# Patient Record
Sex: Male | Born: 1940 | Race: White | Hispanic: No | Marital: Married | Smoking: Former smoker
Health system: Southern US, Community
[De-identification: ages and names within clinical notes are randomized; demographics above are authoritative.]

## PROBLEM LIST (undated history)

## (undated) DIAGNOSIS — E785 Hyperlipidemia, unspecified: Secondary | ICD-10-CM

## (undated) DIAGNOSIS — G4733 Obstructive sleep apnea (adult) (pediatric): Secondary | ICD-10-CM

## (undated) DIAGNOSIS — R0602 Shortness of breath: Secondary | ICD-10-CM

## (undated) DIAGNOSIS — H8110 Benign paroxysmal vertigo, unspecified ear: Secondary | ICD-10-CM

## (undated) DIAGNOSIS — I38 Endocarditis, valve unspecified: Secondary | ICD-10-CM

## (undated) DIAGNOSIS — I10 Essential (primary) hypertension: Secondary | ICD-10-CM

## (undated) DIAGNOSIS — R339 Retention of urine, unspecified: Secondary | ICD-10-CM

## (undated) DIAGNOSIS — I441 Atrioventricular block, second degree: Secondary | ICD-10-CM

## (undated) DIAGNOSIS — N400 Enlarged prostate without lower urinary tract symptoms: Secondary | ICD-10-CM

## (undated) DIAGNOSIS — I2510 Atherosclerotic heart disease of native coronary artery without angina pectoris: Secondary | ICD-10-CM

## (undated) DIAGNOSIS — K219 Gastro-esophageal reflux disease without esophagitis: Secondary | ICD-10-CM

## (undated) DIAGNOSIS — E119 Type 2 diabetes mellitus without complications: Secondary | ICD-10-CM

## (undated) DIAGNOSIS — J42 Unspecified chronic bronchitis: Secondary | ICD-10-CM

## (undated) HISTORY — DX: Atherosclerotic heart disease of native coronary artery without angina pectoris: I25.10

## (undated) HISTORY — DX: Retention of urine, unspecified: R33.9

## (undated) HISTORY — DX: Benign paroxysmal vertigo, unspecified ear: H81.10

## (undated) HISTORY — DX: Hyperlipidemia, unspecified: E78.5

## (undated) HISTORY — DX: Gastro-esophageal reflux disease without esophagitis: K21.9

## (undated) HISTORY — DX: Type 2 diabetes mellitus without complications: E11.9

## (undated) HISTORY — DX: Essential (primary) hypertension: I10

## (undated) HISTORY — PX: ROTATOR CUFF REPAIR: SHX139

## (undated) HISTORY — DX: Shortness of breath: R06.02

## (undated) HISTORY — DX: Endocarditis, valve unspecified: I38

## (undated) HISTORY — DX: Benign prostatic hyperplasia without lower urinary tract symptoms: N40.0

## (undated) HISTORY — PX: TONSILLECTOMY: SUR1361

## (undated) HISTORY — DX: Atrioventricular block, second degree: I44.1

## (undated) HISTORY — DX: Obstructive sleep apnea (adult) (pediatric): G47.33

## (undated) HISTORY — DX: Unspecified chronic bronchitis: J42

---

## 2006-06-12 ENCOUNTER — Ambulatory Visit: Payer: Self-pay | Admitting: Unknown Physician Specialty

## 2007-03-26 ENCOUNTER — Ambulatory Visit: Payer: Self-pay | Admitting: Unknown Physician Specialty

## 2007-04-05 HISTORY — PX: BRAIN SURGERY: SHX531

## 2007-04-05 HISTORY — PX: REPAIR DURAL / CSF LEAK: SUR1169

## 2007-04-16 ENCOUNTER — Ambulatory Visit: Payer: Self-pay | Admitting: Unknown Physician Specialty

## 2007-04-24 ENCOUNTER — Ambulatory Visit: Payer: Self-pay | Admitting: Unknown Physician Specialty

## 2011-06-03 HISTORY — PX: COLECTOMY: SHX59

## 2011-08-12 ENCOUNTER — Ambulatory Visit: Payer: Self-pay | Admitting: Unknown Physician Specialty

## 2011-08-19 ENCOUNTER — Ambulatory Visit: Payer: Self-pay | Admitting: Unknown Physician Specialty

## 2011-09-15 ENCOUNTER — Ambulatory Visit: Payer: Self-pay | Admitting: Unknown Physician Specialty

## 2012-04-04 HISTORY — PX: CATARACT EXTRACTION: SUR2

## 2013-03-12 ENCOUNTER — Ambulatory Visit: Payer: Self-pay | Admitting: Ophthalmology

## 2013-08-29 ENCOUNTER — Encounter: Payer: Self-pay | Admitting: Neurology

## 2013-09-02 ENCOUNTER — Encounter: Payer: Self-pay | Admitting: Neurology

## 2013-09-13 DIAGNOSIS — K219 Gastro-esophageal reflux disease without esophagitis: Secondary | ICD-10-CM | POA: Insufficient documentation

## 2013-09-13 DIAGNOSIS — E782 Mixed hyperlipidemia: Secondary | ICD-10-CM | POA: Insufficient documentation

## 2013-09-13 DIAGNOSIS — I441 Atrioventricular block, second degree: Secondary | ICD-10-CM | POA: Insufficient documentation

## 2013-09-17 DIAGNOSIS — R0602 Shortness of breath: Secondary | ICD-10-CM | POA: Insufficient documentation

## 2013-10-02 ENCOUNTER — Encounter: Payer: Self-pay | Admitting: Neurology

## 2013-10-27 DIAGNOSIS — G4733 Obstructive sleep apnea (adult) (pediatric): Secondary | ICD-10-CM | POA: Insufficient documentation

## 2013-10-27 DIAGNOSIS — E119 Type 2 diabetes mellitus without complications: Secondary | ICD-10-CM | POA: Insufficient documentation

## 2013-10-27 DIAGNOSIS — J449 Chronic obstructive pulmonary disease, unspecified: Secondary | ICD-10-CM | POA: Insufficient documentation

## 2013-10-27 DIAGNOSIS — I2510 Atherosclerotic heart disease of native coronary artery without angina pectoris: Secondary | ICD-10-CM | POA: Insufficient documentation

## 2013-10-27 DIAGNOSIS — R251 Tremor, unspecified: Secondary | ICD-10-CM | POA: Insufficient documentation

## 2013-11-02 ENCOUNTER — Encounter: Payer: Self-pay | Admitting: Neurology

## 2013-11-20 ENCOUNTER — Ambulatory Visit: Payer: Self-pay | Admitting: Ophthalmology

## 2014-04-30 ENCOUNTER — Emergency Department: Payer: Self-pay | Admitting: Emergency Medicine

## 2014-04-30 LAB — URINALYSIS, COMPLETE
BILIRUBIN, UR: NEGATIVE
BLOOD: NEGATIVE
Bacteria: NONE SEEN
Glucose,UR: NEGATIVE mg/dL (ref 0–75)
Nitrite: NEGATIVE
Ph: 5 (ref 4.5–8.0)
Protein: 30
Specific Gravity: 1.024 (ref 1.003–1.030)
WBC UR: 40 /HPF (ref 0–5)

## 2014-04-30 LAB — CBC WITH DIFFERENTIAL/PLATELET
Basophil #: 0 10*3/uL (ref 0.0–0.1)
Basophil %: 0.4 %
Eosinophil #: 0.2 10*3/uL (ref 0.0–0.7)
Eosinophil %: 2.1 %
HCT: 48.1 % (ref 40.0–52.0)
HGB: 15.8 g/dL (ref 13.0–18.0)
LYMPHS ABS: 1.9 10*3/uL (ref 1.0–3.6)
Lymphocyte %: 21.7 %
MCH: 29.9 pg (ref 26.0–34.0)
MCHC: 32.8 g/dL (ref 32.0–36.0)
MCV: 91 fL (ref 80–100)
MONO ABS: 0.9 x10 3/mm (ref 0.2–1.0)
MONOS PCT: 10.7 %
NEUTROS PCT: 65.1 %
Neutrophil #: 5.6 10*3/uL (ref 1.4–6.5)
PLATELETS: 238 10*3/uL (ref 150–440)
RBC: 5.28 10*6/uL (ref 4.40–5.90)
RDW: 14.1 % (ref 11.5–14.5)
WBC: 8.6 10*3/uL (ref 3.8–10.6)

## 2014-04-30 LAB — BASIC METABOLIC PANEL
ANION GAP: 9 (ref 7–16)
BUN: 22 mg/dL — ABNORMAL HIGH (ref 7–18)
CALCIUM: 9.7 mg/dL (ref 8.5–10.1)
CO2: 27 mmol/L (ref 21–32)
Chloride: 102 mmol/L (ref 98–107)
Creatinine: 1.36 mg/dL — ABNORMAL HIGH (ref 0.60–1.30)
EGFR (Non-African Amer.): 55 — ABNORMAL LOW
Glucose: 118 mg/dL — ABNORMAL HIGH (ref 65–99)
Osmolality: 280 (ref 275–301)
Potassium: 3.8 mmol/L (ref 3.5–5.1)
Sodium: 138 mmol/L (ref 136–145)

## 2014-04-30 LAB — TROPONIN I
Troponin-I: 0.04 ng/mL
Troponin-I: 0.02 ng/mL

## 2014-05-05 ENCOUNTER — Emergency Department: Payer: Self-pay | Admitting: Emergency Medicine

## 2014-05-05 LAB — CBC WITH DIFFERENTIAL/PLATELET
BASOS ABS: 0.1 10*3/uL (ref 0.0–0.1)
Basophil %: 0.6 %
EOS PCT: 2.3 %
Eosinophil #: 0.3 10*3/uL (ref 0.0–0.7)
HCT: 44.5 % (ref 40.0–52.0)
HGB: 14.3 g/dL (ref 13.0–18.0)
LYMPHS ABS: 2.7 10*3/uL (ref 1.0–3.6)
LYMPHS PCT: 21.7 %
MCH: 29.2 pg (ref 26.0–34.0)
MCHC: 32.1 g/dL (ref 32.0–36.0)
MCV: 91 fL (ref 80–100)
MONOS PCT: 12.5 %
Monocyte #: 1.5 x10 3/mm — ABNORMAL HIGH (ref 0.2–1.0)
Neutrophil #: 7.7 10*3/uL — ABNORMAL HIGH (ref 1.4–6.5)
Neutrophil %: 62.9 %
PLATELETS: 229 10*3/uL (ref 150–440)
RBC: 4.9 10*6/uL (ref 4.40–5.90)
RDW: 14.1 % (ref 11.5–14.5)
WBC: 12.3 10*3/uL — AB (ref 3.8–10.6)

## 2014-05-05 LAB — BASIC METABOLIC PANEL
ANION GAP: 11 (ref 7–16)
BUN: 27 mg/dL — AB (ref 7–18)
CHLORIDE: 105 mmol/L (ref 98–107)
CREATININE: 1.52 mg/dL — AB (ref 0.60–1.30)
Calcium, Total: 8.6 mg/dL (ref 8.5–10.1)
Co2: 25 mmol/L (ref 21–32)
EGFR (African American): 59 — ABNORMAL LOW
EGFR (Non-African Amer.): 48 — ABNORMAL LOW
Glucose: 124 mg/dL — ABNORMAL HIGH (ref 65–99)
Osmolality: 288 (ref 275–301)
Potassium: 3.5 mmol/L (ref 3.5–5.1)
Sodium: 141 mmol/L (ref 136–145)

## 2014-05-05 LAB — URINALYSIS, COMPLETE
BILIRUBIN, UR: NEGATIVE
Bacteria: NONE SEEN
Blood: NEGATIVE
Glucose,UR: NEGATIVE mg/dL (ref 0–75)
Hyaline Cast: 3
Leukocyte Esterase: NEGATIVE
NITRITE: NEGATIVE
Ph: 5 (ref 4.5–8.0)
Protein: 30
RBC,UR: 2 /HPF (ref 0–5)
Specific Gravity: 1.025 (ref 1.003–1.030)
Squamous Epithelial: <1
WBC UR: 13 /HPF (ref 0–5)

## 2014-05-05 LAB — TROPONIN I: TROPONIN-I: 0.03 ng/mL

## 2014-05-06 ENCOUNTER — Emergency Department: Payer: Self-pay | Admitting: Emergency Medicine

## 2014-05-06 LAB — URINALYSIS, COMPLETE
Bacteria: NONE SEEN
Bilirubin,UR: NEGATIVE
Blood: NEGATIVE
Glucose,UR: NEGATIVE mg/dL (ref 0–75)
Ketone: NEGATIVE
Leukocyte Esterase: NEGATIVE
NITRITE: NEGATIVE
PROTEIN: NEGATIVE
Ph: 6 (ref 4.5–8.0)
Specific Gravity: 1.015 (ref 1.003–1.030)
Squamous Epithelial: NONE SEEN

## 2014-05-06 LAB — COMPREHENSIVE METABOLIC PANEL
ALBUMIN: 3 g/dL — AB (ref 3.4–5.0)
ANION GAP: 11 (ref 7–16)
Alkaline Phosphatase: 74 U/L (ref 46–116)
BUN: 23 mg/dL — ABNORMAL HIGH (ref 7–18)
Bilirubin,Total: 0.8 mg/dL (ref 0.2–1.0)
CALCIUM: 9.1 mg/dL (ref 8.5–10.1)
CHLORIDE: 105 mmol/L (ref 98–107)
CO2: 24 mmol/L (ref 21–32)
Creatinine: 1.64 mg/dL — ABNORMAL HIGH (ref 0.60–1.30)
EGFR (African American): 54 — ABNORMAL LOW
EGFR (Non-African Amer.): 44 — ABNORMAL LOW
Glucose: 136 mg/dL — ABNORMAL HIGH (ref 65–99)
Osmolality: 285 (ref 275–301)
Potassium: 3.7 mmol/L (ref 3.5–5.1)
SGOT(AST): 32 U/L (ref 15–37)
SGPT (ALT): 26 U/L (ref 14–63)
SODIUM: 140 mmol/L (ref 136–145)
TOTAL PROTEIN: 7 g/dL (ref 6.4–8.2)

## 2014-05-06 LAB — CBC
HCT: 44.8 % (ref 40.0–52.0)
HGB: 14.7 g/dL (ref 13.0–18.0)
MCH: 29.4 pg (ref 26.0–34.0)
MCHC: 32.7 g/dL (ref 32.0–36.0)
MCV: 90 fL (ref 80–100)
Platelet: 225 10*3/uL (ref 150–440)
RBC: 4.98 10*6/uL (ref 4.40–5.90)
RDW: 14.6 % — ABNORMAL HIGH (ref 11.5–14.5)
WBC: 12.6 10*3/uL — ABNORMAL HIGH (ref 3.8–10.6)

## 2014-05-07 DIAGNOSIS — F325 Major depressive disorder, single episode, in full remission: Secondary | ICD-10-CM | POA: Insufficient documentation

## 2014-05-07 LAB — URINE CULTURE

## 2014-07-11 DIAGNOSIS — I340 Nonrheumatic mitral (valve) insufficiency: Secondary | ICD-10-CM | POA: Insufficient documentation

## 2014-07-11 DIAGNOSIS — I071 Rheumatic tricuspid insufficiency: Secondary | ICD-10-CM | POA: Insufficient documentation

## 2014-10-03 DIAGNOSIS — G9600 Cerebrospinal fluid leak, unspecified: Secondary | ICD-10-CM | POA: Insufficient documentation

## 2014-10-03 DIAGNOSIS — G20 Parkinson's disease: Secondary | ICD-10-CM | POA: Insufficient documentation

## 2014-10-03 DIAGNOSIS — G9782 Other postprocedural complications and disorders of nervous system: Secondary | ICD-10-CM | POA: Insufficient documentation

## 2014-11-20 DIAGNOSIS — Z0000 Encounter for general adult medical examination without abnormal findings: Secondary | ICD-10-CM | POA: Insufficient documentation

## 2014-12-05 ENCOUNTER — Encounter: Payer: Self-pay | Admitting: *Deleted

## 2014-12-22 ENCOUNTER — Ambulatory Visit (INDEPENDENT_AMBULATORY_CARE_PROVIDER_SITE_OTHER): Payer: Medicare Other | Admitting: Urology

## 2014-12-22 ENCOUNTER — Encounter: Payer: Self-pay | Admitting: Urology

## 2014-12-22 VITALS — BP 151/75 | HR 64 | Ht 72.0 in | Wt 193.3 lb

## 2014-12-22 DIAGNOSIS — R972 Elevated prostate specific antigen [PSA]: Secondary | ICD-10-CM

## 2014-12-22 DIAGNOSIS — N401 Enlarged prostate with lower urinary tract symptoms: Secondary | ICD-10-CM | POA: Diagnosis not present

## 2014-12-22 DIAGNOSIS — N138 Other obstructive and reflux uropathy: Secondary | ICD-10-CM

## 2014-12-22 NOTE — Progress Notes (Signed)
12/22/2014 10:53 AM   Gregory Griffin 11-15-1940 [id number]  Referring Paxson Harrower: No referring Deakin Lacek defined for this encounter.  Chief Complaint  Patient presents with  . Elevated PSA    6 month recheck    HPI: Patient is a 74 year old white male with a history of elevated PSA and BPH with LUTS who presents today for 6 month follow-up.    Elevated PSA Patient was found to have a PSA of 6.3 ng/mL on 06/19/2014.   He started finasteride in February 2016.    BPH WITH LUTS His IPSS score today is 1, which is mild lower urinary tract symptomatology. He is pleased with his quality life due to his urinary symptoms.  He has a prior history of urinary retention, but he is voiding well since he started tamsulosin and finasteride.   He denies any dysuria, hematuria or suprapubic pain.   He also denies any recent fevers, chills, nausea or vomiting.   He does not have a family history of PCa.      IPSS      12/22/14 1000       International Prostate Symptom Score   How often have you had the sensation of not emptying your bladder? Not at All     How often have you had to urinate less than every two hours? Not at All     How often have you found you stopped and started again several times when you urinated? Not at All     How often have you found it difficult to postpone urination? Not at All     How often have you had a weak urinary stream? Not at All     How often have you had to strain to start urination? Not at All     How many times did you typically get up at night to urinate? 1 Time     Total IPSS Score 1     Quality of Life due to urinary symptoms   If you were to spend the rest of your life with your urinary condition just the way it is now how would you feel about that? Pleased        Score:  1-7 Mild 8-19 Moderate 20-35 Severe     PMH: Past Medical History  Diagnosis Date  . GERD (gastroesophageal reflux disease)   . SOB (shortness of breath)   . OSA  (obstructive sleep apnea)   . HLD (hyperlipidemia)   . Valvular heart disease   . AV block, 2nd degree   . Chronic bronchitis   . Benign paroxysmal positional vertigo   . CAD (coronary artery disease)   . Diabetes mellitus, type II   . BPH (benign prostatic hyperplasia)   . HTN (hypertension)   . Urinary retention     Surgical History: Past Surgical History  Procedure Laterality Date  . Brain surgery  2009  . Tonsillectomy    . Cataract extraction Right 2014    Left 2015  . Rotator cuff repair Bilateral   . Colectomy  06/2011  . Repair dural / csf leak  2009    Home Medications:    Medication List       This list is accurate as of: 12/22/14 10:53 AM.  Always use your most recent med list.               amLODipine 5 MG tablet  Commonly known as:  NORVASC  Take 5 mg by mouth daily.  aspirin EC 81 MG tablet  Take by mouth.     buPROPion 150 MG 12 hr tablet  Commonly known as:  ZYBAN  Take 150 mg by mouth 2 (two) times daily.     buPROPion 150 MG 12 hr tablet  Commonly known as:  WELLBUTRIN SR  TAKE 1 TABLET BY MOUTH TWICE DAILY     carbidopa-levodopa 25-100 MG per tablet  Commonly known as:  SINEMET IR  Take 1.5 tablets three times daily for 2 weeks, then increase to 2 tablets three times daily and continue     CENTRUM SILVER ADULT 50+ PO  Take by mouth.     PRESERVISION AREDS 2 PO  Take by mouth.     docusate sodium 100 MG capsule  Commonly known as:  COLACE  Take by mouth.     finasteride 5 MG tablet  Commonly known as:  PROSCAR  Take 5 mg by mouth daily.     lisinopril-hydrochlorothiazide 20-25 MG per tablet  Commonly known as:  PRINZIDE,ZESTORETIC  Take 1 tablet by mouth daily.     multivitamin tablet  Take 1 tablet by mouth daily.     omeprazole 20 MG capsule  Commonly known as:  PRILOSEC  Take 20 mg by mouth daily.     simvastatin 20 MG tablet  Commonly known as:  ZOCOR  Take 20 mg by mouth daily.     tamsulosin 0.4 MG Caps  capsule  Commonly known as:  FLOMAX  Take 0.4 mg by mouth daily.        Allergies:  Allergies  Allergen Reactions  . Percocet [Oxycodone-Acetaminophen]     Family History: Family History  Problem Relation Age of Onset  . Stroke Father   . Heart attack Mother   . Blindness Mother   . Epilepsy Father   . Kidney disease Neg Hx   . Prostate cancer Neg Hx     Social History:  reports that he has quit smoking. He does not have any smokeless tobacco history on file. He reports that he drinks alcohol. He reports that he does not use illicit drugs.  ROS: UROLOGY Frequent Urination?: No Hard to postpone urination?: No Burning/pain with urination?: No Get up at night to urinate?: No Leakage of urine?: No Urine stream starts and stops?: No Trouble starting stream?: No Do you have to strain to urinate?: No Blood in urine?: No Urinary tract infection?: No Sexually transmitted disease?: No Injury to kidneys or bladder?: No Painful intercourse?: No Weak stream?: No Erection problems?: No Penile pain?: No  Gastrointestinal Nausea?: No Vomiting?: No Indigestion/heartburn?: No Diarrhea?: No Constipation?: No  Constitutional Fever: No Night sweats?: No Weight loss?: No Fatigue?: Yes  Skin Skin rash/lesions?: No Itching?: No  Eyes Blurred vision?: No Double vision?: No  Ears/Nose/Throat Sore throat?: No Sinus problems?: Yes  Hematologic/Lymphatic Swollen glands?: No Easy bruising?: Yes  Cardiovascular Leg swelling?: No Chest pain?: No  Respiratory Cough?: No Shortness of breath?: Yes  Endocrine Excessive thirst?: No  Musculoskeletal Back pain?: No Joint pain?: Yes  Neurological Headaches?: No Dizziness?: No  Psychologic Depression?: No Anxiety?: No  Physical Exam: BP 151/75 mmHg  Pulse 64  Ht 6' (1.829 m)  Wt 193 lb 4.8 oz (87.68 kg)  BMI 26.21 kg/m2  GU: Patient with uncircumcised phallus. Foreskin easily retracted  Urethral meatus is  patent.  No penile discharge. No penile lesions or rashes. Scrotum without lesions, cysts, rashes and/or edema.  Testicles are located scrotally bilaterally. No masses are  appreciated in the testicles. Left and right epididymis are normal. Rectal: Patient with  normal sphincter tone. Perineum without scarring or rashes. No rectal masses are appreciated. Prostate is approximately 60 grams, no nodules are appreciated. Seminal vesicles are normal.   Laboratory Data: Lab Results  Component Value Date   WBC 12.6* 05/06/2014   HGB 14.7 05/06/2014   HCT 44.8 05/06/2014   MCV 90 05/06/2014   PLT 225 05/06/2014   Lab Results  Component Value Date   CREATININE 1.64* 05/06/2014    Assessment & Plan:    1. Elevated PSA:    Patient's most recent PSA was 6.3 ng/mL on 06/19/2014.  His DRE demonstrates benign enlargement. We will increase the trigger for a prostate biopsy (e.g. to 10ng/mL) based on the evidence that these men have the most to gain from a diagnosis and treatment of prostate cancer over a decade.  - PSA  2.  BPH with LUTS:  Patient's IPSS score is 1/1.   His DRE demonstrates benign enlargement.  Patient will continue tamsulosin and finasteride. He does not need a refill for these medications at this time.  He will follow up in 6 months for a PSA, DRE and an IPSS score.     No Follow-up on file.  Michiel Cowboy, PA-C  [facility] [address] [place], [place] [address] [phone]

## 2014-12-23 ENCOUNTER — Telehealth: Payer: Self-pay

## 2014-12-23 LAB — PSA: PROSTATE SPECIFIC AG, SERUM: 2.2 ng/mL (ref 0.0–4.0)

## 2014-12-23 NOTE — Telephone Encounter (Signed)
-----   Message from Harle Battiest, PA-C sent at 12/23/2014  8:15 AM EDT ----- Patient's PSA is stable.  We will see him in 6 months.  PSA to be drawn before his next appointment.

## 2014-12-23 NOTE — Telephone Encounter (Signed)
Spoke with pt in reference to lab results. Pt voiced understanding.  

## 2014-12-25 DIAGNOSIS — I10 Essential (primary) hypertension: Secondary | ICD-10-CM | POA: Insufficient documentation

## 2015-06-22 ENCOUNTER — Ambulatory Visit (INDEPENDENT_AMBULATORY_CARE_PROVIDER_SITE_OTHER): Payer: Medicare Other | Admitting: Urology

## 2015-06-22 ENCOUNTER — Encounter: Payer: Self-pay | Admitting: Urology

## 2015-06-22 VITALS — BP 139/76 | HR 86 | Ht 72.0 in | Wt 190.1 lb

## 2015-06-22 DIAGNOSIS — N138 Other obstructive and reflux uropathy: Secondary | ICD-10-CM

## 2015-06-22 DIAGNOSIS — N401 Enlarged prostate with lower urinary tract symptoms: Secondary | ICD-10-CM | POA: Diagnosis not present

## 2015-06-22 DIAGNOSIS — R972 Elevated prostate specific antigen [PSA]: Secondary | ICD-10-CM

## 2015-06-22 NOTE — Progress Notes (Signed)
11:03 AM   Gregory Griffin 08/20/1940 [id number]  Referring provider: Lauro Regulus, MD [address] [facility] [place] [facility] [place], [place] [address]  Chief Complaint  Patient presents with  . Elevated PSA    6 month follow up  . Benign Prostatic Hypertrophy    HPI: Patient is a 75 year old white male with a history of elevated PSA and BPH with LUTS who presents today for 6 month follow-up.    Elevated PSA Patient was found to have a PSA of 6.3 ng/mL on 06/19/2014.   He started finasteride in February 2016.  His last PSA was 2.2 ng/mL on 12/22/2014.    BPH WITH LUTS His IPSS score today is 3, which is mild lower urinary tract symptomatology. He is pleased with his quality life due to his urinary symptoms.  He has a prior history of urinary retention, but he is voiding well since he started tamsulosin and finasteride.   He denies any dysuria, hematuria or suprapubic pain.   He also denies any recent fevers, chills, nausea or vomiting.   He does not have a family history of PCa.      IPSS      06/22/15 1000       International Prostate Symptom Score   How often have you had the sensation of not emptying your bladder? Not at All     How often have you had to urinate less than every two hours? Not at All     How often have you found you stopped and started again several times when you urinated? Less than 1 in 5 times     How often have you found it difficult to postpone urination? Less than 1 in 5 times     How often have you had a weak urinary stream? Not at All     How often have you had to strain to start urination? Not at All     How many times did you typically get up at night to urinate? 1 Time     Total IPSS Score 3     Quality of Life due to urinary symptoms   If you were to spend the rest of your life with your urinary condition just the way it is now how would you feel about that? Mostly Satisfied        Score:  1-7 Mild 8-19 Moderate 20-35  Severe     PMH: Past Medical History  Diagnosis Date  . GERD (gastroesophageal reflux disease)   . SOB (shortness of breath)   . OSA (obstructive sleep apnea)   . HLD (hyperlipidemia)   . Valvular heart disease   . AV block, 2nd degree   . Chronic bronchitis (HCC)   . Benign paroxysmal positional vertigo   . CAD (coronary artery disease)   . Diabetes mellitus, type II (HCC)   . BPH (benign prostatic hyperplasia)   . HTN (hypertension)   . Urinary retention     Surgical History: Past Surgical History  Procedure Laterality Date  . Brain surgery  2009  . Tonsillectomy    . Cataract extraction Right 2014    Left 2015  . Rotator cuff repair Bilateral   . Colectomy  06/2011  . Repair dural / csf leak  2009    Home Medications:    Medication List       This list is accurate as of: 06/22/15 11:03 AM.  Always use your most recent med  list.               amLODipine 5 MG tablet  Commonly known as:  NORVASC  Take 5 mg by mouth daily.     aspirin EC 81 MG tablet  Take by mouth.     buPROPion 150 MG 12 hr tablet  Commonly known as:  ZYBAN  Take 150 mg by mouth 2 (two) times daily.     buPROPion 150 MG 12 hr tablet  Commonly known as:  WELLBUTRIN SR  Reported on 06/22/2015     carbidopa-levodopa 25-100 MG tablet  Commonly known as:  SINEMET IR  take 1/2 tablet three times daily     CENTRUM SILVER ADULT 50+ PO  Take by mouth.     PRESERVISION AREDS 2 PO  Take by mouth. Reported on 06/22/2015     docusate sodium 100 MG capsule  Commonly known as:  COLACE  Take by mouth.     finasteride 5 MG tablet  Commonly known as:  PROSCAR  Take 5 mg by mouth daily.     gabapentin 100 MG capsule  Commonly known as:  NEURONTIN     lisinopril-hydrochlorothiazide 20-25 MG tablet  Commonly known as:  PRINZIDE,ZESTORETIC  Take 1 tablet by mouth daily.     multivitamin tablet  Take 1 tablet by mouth daily. Reported on 06/22/2015     omeprazole 20 MG capsule  Commonly  known as:  PRILOSEC  Take 20 mg by mouth daily.     simvastatin 20 MG tablet  Commonly known as:  ZOCOR  Take 20 mg by mouth daily.     tamsulosin 0.4 MG Caps capsule  Commonly known as:  FLOMAX  Take 0.4 mg by mouth daily. Reported on 06/22/2015        Allergies:  Allergies  Allergen Reactions  . Percocet [Oxycodone-Acetaminophen]     Family History: Family History  Problem Relation Age of Onset  . Stroke Father   . Heart attack Mother   . Blindness Mother   . Epilepsy Father   . Kidney disease Neg Hx   . Prostate cancer Neg Hx     Social History:  reports that he has quit smoking. He does not have any smokeless tobacco history on file. He reports that he drinks alcohol. He reports that he does not use illicit drugs.  ROS: UROLOGY Frequent Urination?: No Hard to postpone urination?: Yes Burning/pain with urination?: No Get up at night to urinate?: Yes Leakage of urine?: No Urine stream starts and stops?: No Trouble starting stream?: No Do you have to strain to urinate?: No Blood in urine?: No Urinary tract infection?: No Sexually transmitted disease?: No Injury to kidneys or bladder?: No Painful intercourse?: No Weak stream?: No Erection problems?: Yes Penile pain?: No  Gastrointestinal Nausea?: No Vomiting?: No Indigestion/heartburn?: No Diarrhea?: No Constipation?: Yes  Constitutional Fever: No Night sweats?: No Weight loss?: No Fatigue?: Yes  Skin Skin rash/lesions?: No Itching?: No  Eyes Blurred vision?: No Double vision?: No  Ears/Nose/Throat Sore throat?: No Sinus problems?: Yes  Hematologic/Lymphatic Swollen glands?: No Easy bruising?: Yes  Cardiovascular Leg swelling?: Yes Chest pain?: No  Respiratory Cough?: No Shortness of breath?: Yes  Endocrine Excessive thirst?: No  Musculoskeletal Back pain?: No Joint pain?: Yes  Neurological Headaches?: No Dizziness?: No  Psychologic Depression?: No Anxiety?:  Yes  Physical Exam: BP 139/76 mmHg  Pulse 86  Ht 6' (1.829 m)  Wt 190 lb 1.6 oz (86.229 kg)  BMI 25.78  kg/m2  GU: Patient with uncircumcised phallus. Foreskin easily retracted  Urethral meatus is patent.  No penile discharge. No penile lesions or rashes. Scrotum without lesions, cysts, rashes and/or edema.  Testicles are located scrotally bilaterally. No masses are appreciated in the testicles. Left and right epididymis are normal. Rectal: Patient with  normal sphincter tone. Perineum without scarring or rashes. No rectal masses are appreciated. Prostate is approximately 60 grams, no nodules are appreciated. Seminal vesicles are normal.   Laboratory Data: Lab Results  Component Value Date   WBC 12.6* 05/06/2014   HGB 14.7 05/06/2014   HCT 44.8 05/06/2014   MCV 90 05/06/2014   PLT 225 05/06/2014   Lab Results  Component Value Date   CREATININE 1.64* 05/06/2014    Assessment & Plan:    1. Elevated PSA:    Patient's most recent PSA was 2.2 ng/mL on 12/19/2014.  Marland Kitchen.  His DRE demonstrates benign enlargement. We will increase the trigger for a prostate biopsy (e.g. to 10ng/mL) based on the evidence that these men have the most to gain from a diagnosis and treatment of prostate cancer over a decade.  We will draw a PSA today.  If stable, we will repeat the PSA and exam in 6 months.    - PSA  2.  BPH with LUTS:  Patient's IPSS score is 3/1.   His DRE demonstrates benign enlargement.  Patient will continue tamsulosin and finasteride. He does not need a refill for these medications at this time.  He will follow up in 6 months for a PSA, DRE and an IPSS score.     Return in about 6 months (around 12/23/2015) for IPSS score and exam.  Michiel CowboySHANNON Shiraz Bastyr, [facility]-C  [facility] [address], [address] [place], [place] [address] [phone]

## 2015-06-23 ENCOUNTER — Telehealth: Payer: Self-pay

## 2015-06-23 LAB — PSA: Prostate Specific Ag, Serum: 2.6 ng/mL (ref 0.0–4.0)

## 2015-06-23 NOTE — Telephone Encounter (Signed)
Advised patient of PSA results.  Patient voiced understanding.  He is scheduled for labs and a follow up appt in 6 months.

## 2015-06-23 NOTE — Telephone Encounter (Signed)
LMOM

## 2015-06-23 NOTE — Telephone Encounter (Signed)
-----   Message from Shannon A McGowan, PA-C sent at 06/23/2015  8:07 AM EDT ----- PSA is stable.  We will see him in 6 months. 

## 2015-06-24 ENCOUNTER — Telehealth: Payer: Self-pay

## 2015-06-24 NOTE — Telephone Encounter (Signed)
-----   Message from Harle BattiestShannon A McGowan, PA-C sent at 06/23/2015  8:07 AM EDT ----- PSA is stable.  We will see him in 6 months.

## 2015-06-24 NOTE — Telephone Encounter (Signed)
Spoke with pt in reference to PSA. Pt voiced understanding.  

## 2015-07-21 ENCOUNTER — Telehealth: Payer: Self-pay

## 2015-07-21 NOTE — Telephone Encounter (Signed)
[facility] [address], requesting refill  On tamsulosin and finasteride. He is a patient of [facility] not [facility] ., sent fax to [facility] advising them of this info

## 2015-07-23 ENCOUNTER — Telehealth: Payer: Self-pay

## 2015-07-23 NOTE — Telephone Encounter (Signed)
  .  [facility] [address], requesting refill On tamsulosin . He is a patient of [facility] not [facility] ., sent fax to [facility] advising them of this info

## 2015-08-24 ENCOUNTER — Telehealth: Payer: Self-pay | Admitting: Urology

## 2015-08-24 DIAGNOSIS — N400 Enlarged prostate without lower urinary tract symptoms: Secondary | ICD-10-CM

## 2015-08-24 MED ORDER — TAMSULOSIN HCL 0.4 MG PO CAPS: 0.4000 mg | ORAL_CAPSULE | Freq: Every day | ORAL | Status: DC

## 2015-08-24 MED ORDER — FINASTERIDE 5 MG PO TABS: 5.0000 mg | ORAL_TABLET | Freq: Every day | ORAL | Status: DC

## 2015-08-24 NOTE — Telephone Encounter (Signed)
Medication was refilled and pt made aware.

## 2015-08-24 NOTE — Telephone Encounter (Signed)
Patient called and said that his pharmacy ([facility] [address]) has faxed over refill request and has not heard anything back. Can you call the patient back @ [phone] and speak with him about this?   Thanks, Marcelino DusterMichelle

## 2015-12-11 ENCOUNTER — Other Ambulatory Visit: Payer: Self-pay

## 2015-12-11 DIAGNOSIS — R972 Elevated prostate specific antigen [PSA]: Secondary | ICD-10-CM

## 2015-12-16 ENCOUNTER — Other Ambulatory Visit: Payer: Medicare Other

## 2015-12-16 DIAGNOSIS — R972 Elevated prostate specific antigen [PSA]: Secondary | ICD-10-CM

## 2015-12-17 LAB — PSA: PROSTATE SPECIFIC AG, SERUM: 1.8 ng/mL (ref 0.0–4.0)

## 2015-12-23 ENCOUNTER — Encounter: Payer: Self-pay | Admitting: Urology

## 2015-12-23 ENCOUNTER — Ambulatory Visit (INDEPENDENT_AMBULATORY_CARE_PROVIDER_SITE_OTHER): Payer: Medicare Other | Admitting: Urology

## 2015-12-23 VITALS — BP 129/69 | HR 75 | Ht 72.0 in | Wt 186.0 lb

## 2015-12-23 DIAGNOSIS — N138 Other obstructive and reflux uropathy: Secondary | ICD-10-CM

## 2015-12-23 DIAGNOSIS — N401 Enlarged prostate with lower urinary tract symptoms: Secondary | ICD-10-CM

## 2015-12-23 DIAGNOSIS — Z87898 Personal history of other specified conditions: Secondary | ICD-10-CM

## 2015-12-23 MED ORDER — FINASTERIDE 5 MG PO TABS: 5.0000 mg | ORAL_TABLET | Freq: Every day | ORAL | 4 refills | Status: DC

## 2015-12-23 MED ORDER — TAMSULOSIN HCL 0.4 MG PO CAPS: 0.4000 mg | ORAL_CAPSULE | Freq: Every day | ORAL | 4 refills | Status: DC

## 2015-12-23 NOTE — Progress Notes (Signed)
10:33 AM   Gregory Griffin [birth year] [id number]  Referring provider: Lauro Regulus, MD [address] [facility] - [place], [place] [address]  Chief Complaint  Patient presents with  . Benign Prostatic Hypertrophy    73month follow up    HPI: Patient is a 75 year old Caucasian male with a history of elevated PSA and BPH with LUTS who presents today for 6 month follow-up.    History of elevated PSA Patient was found to have a PSA of 6.3 ng/mL on 06/19/2014.   He started finasteride in February 2016.  His recent PSA was 1.8 ng/mL on 12/16/2015.    BPH WITH LUTS His IPSS score today is 2, which is mild lower urinary tract symptomatology. He is pleased with his quality life due to his urinary symptoms.  His previous IPSS score was 3/1.  He has a prior history of urinary retention, but he is voiding well since he started tamsulosin and finasteride.   He denies any dysuria, hematuria or suprapubic pain.   He also denies any recent fevers, chills, nausea or vomiting.   He does not have a family history of PCa.      IPSS    Row Name 12/23/15 1000         International Prostate Symptom Score   How often have you had the sensation of not emptying your bladder? Not at All     How often have you had to urinate less than every two hours? Not at All     How often have you found you stopped and started again several times when you urinated? Not at All     How often have you found it difficult to postpone urination? Less than 1 in 5 times     How often have you had a weak urinary stream? Not at All     How often have you had to strain to start urination? Not at All     How many times did you typically get up at night to urinate? 1 Time     Total IPSS Score 2       Quality of Life due to urinary symptoms   If you were to spend the rest of your life with your urinary condition just the way it is now how would you feel about that? Pleased        Score:  1-7  Mild 8-19 Moderate 20-35 Severe     PMH: Past Medical History:  Diagnosis Date  . AV block, 2nd degree   . Benign paroxysmal positional vertigo   . BPH (benign prostatic hyperplasia)   . CAD (coronary artery disease)   . Chronic bronchitis (HCC)   . Diabetes mellitus, type II (HCC)   . GERD (gastroesophageal reflux disease)   . HLD (hyperlipidemia)   . HTN (hypertension)   . OSA (obstructive sleep apnea)   . SOB (shortness of breath)   . Urinary retention   . Valvular heart disease     Surgical History: Past Surgical History:  Procedure Laterality Date  . BRAIN SURGERY  2009  . CATARACT EXTRACTION Right 2014   Left 2015  . COLECTOMY  06/2011  . REPAIR DURAL / CSF LEAK  2009  . ROTATOR CUFF REPAIR Bilateral   . TONSILLECTOMY      Home Medications:    Medication List       Accurate as of 12/23/15 10:33 AM. Always use your most recent med list.  amLODipine 5 MG tablet Commonly known as:  NORVASC Take 5 mg by mouth daily.   aspirin EC 81 MG tablet Take by mouth.   buPROPion 150 MG 12 hr tablet Commonly known as:  ZYBAN Take 150 mg by mouth 2 (two) times daily.   buPROPion 150 MG 12 hr tablet Commonly known as:  WELLBUTRIN SR Reported on 06/22/2015   carbidopa-levodopa 25-100 MG tablet Commonly known as:  SINEMET IR take 1/2 tablet three times daily   CENTRUM SILVER ADULT 50+ PO Take by mouth.   docusate sodium 100 MG capsule Commonly known as:  COLACE Take by mouth.   finasteride 5 MG tablet Commonly known as:  PROSCAR Take 1 tablet (5 mg total) by mouth daily.   furosemide 20 MG tablet Commonly known as:  LASIX   gabapentin 100 MG capsule Commonly known as:  NEURONTIN   lisinopril-hydrochlorothiazide 20-25 MG tablet Commonly known as:  PRINZIDE,ZESTORETIC Take 1 tablet by mouth daily.   omeprazole 20 MG capsule Commonly known as:  PRILOSEC Take 20 mg by mouth daily.   potassium chloride 10 MEQ tablet Commonly known as:   K-DUR TK 1 T PO D PRN WITH FLUID PILL   simvastatin 20 MG tablet Commonly known as:  ZOCOR Take 20 mg by mouth daily.   tamsulosin 0.4 MG Caps capsule Commonly known as:  FLOMAX Take 1 capsule (0.4 mg total) by mouth daily. Reported on 06/22/2015       Allergies:  Allergies  Allergen Reactions  . Percocet [Oxycodone-Acetaminophen]     Family History: Family History  Problem Relation Age of Onset  . Stroke Father   . Epilepsy Father   . Heart attack Mother   . Blindness Mother   . Kidney disease Neg Hx   . Prostate cancer Neg Hx     Social History:  reports that he has quit smoking. He has never used smokeless tobacco. He reports that he drinks alcohol. He reports that he does not use drugs.  ROS: UROLOGY Frequent Urination?: No Hard to postpone urination?: Yes Burning/pain with urination?: No Get up at night to urinate?: No Leakage of urine?: No Urine stream starts and stops?: No Trouble starting stream?: No Do you have to strain to urinate?: No Blood in urine?: No Urinary tract infection?: No Sexually transmitted disease?: No Injury to kidneys or bladder?: No Painful intercourse?: No Weak stream?: No Erection problems?: No Penile pain?: No  Gastrointestinal Nausea?: No Vomiting?: No Indigestion/heartburn?: No Diarrhea?: No Constipation?: No  Constitutional Fever: No Night sweats?: No Weight loss?: No Fatigue?: Yes  Skin Skin rash/lesions?: No Itching?: No  Eyes Blurred vision?: Yes Double vision?: No  Ears/Nose/Throat Sore throat?: No Sinus problems?: No  Hematologic/Lymphatic Swollen glands?: No Easy bruising?: Yes  Cardiovascular Leg swelling?: Yes Chest pain?: No  Respiratory Cough?: No Shortness of breath?: No  Endocrine Excessive thirst?: No  Musculoskeletal Back pain?: No Joint pain?: No  Neurological Headaches?: No Dizziness?: No  Psychologic Depression?: No Anxiety?: No  Physical Exam: BP 129/69   Pulse  75   Ht 6' (1.829 m)   Wt 186 lb (84.4 kg)   BMI 25.23 kg/m   GU: Patient with uncircumcised phallus. Foreskin easily retracted  Urethral meatus is patent.  No penile discharge. No penile lesions or rashes. Scrotum without lesions, cysts, rashes and/or edema.  Testicles are located scrotally bilaterally. No masses are appreciated in the testicles. Left and right epididymis are normal. Rectal: Patient with  normal sphincter tone. Perineum without  scarring or rashes. No rectal masses are appreciated. Prostate is approximately 60 grams, no nodules are appreciated. Seminal vesicles are normal.   Laboratory Data: Lab Results  Component Value Date   WBC 12.6 (H) 05/06/2014   HGB 14.7 05/06/2014   HCT 44.8 05/06/2014   MCV 90 05/06/2014   PLT 225 05/06/2014   Lab Results  Component Value Date   CREATININE 1.64 (H) 05/06/2014    Assessment & Plan:    1. History of elevated PSA:      - Patient's most recent PSA was 1.8 ng/mL on 12/19/2014  - RTC in 6 months for PSA and exam  - If PSA remains low, we will discuss extending time between visits or discontinuing screening  2.  BPH with LUTS  - IPSS score is 2/1, it is improving  - Continue conservative management, avoiding bladder irritants and timed voiding's  - Continue tamsulosin 0.4 mg daily and finasteride 5 mg daily, refills given today   - RTC in 6 months for IPSS, PSA and exam   Return in about 6 months (around 06/21/2016) for IPSS, PSA and exam.  Michiel CowboySHANNON Ilham Roughton, [facility]  [facility] [address], [address] [place], [place] [address] [phone]

## 2016-02-18 ENCOUNTER — Other Ambulatory Visit: Payer: Self-pay | Admitting: Urology

## 2016-02-18 DIAGNOSIS — N138 Other obstructive and reflux uropathy: Secondary | ICD-10-CM

## 2016-02-18 DIAGNOSIS — N401 Enlarged prostate with lower urinary tract symptoms: Principal | ICD-10-CM

## 2016-03-19 ENCOUNTER — Other Ambulatory Visit: Payer: Self-pay | Admitting: Urology

## 2016-03-19 DIAGNOSIS — N138 Other obstructive and reflux uropathy: Secondary | ICD-10-CM

## 2016-03-19 DIAGNOSIS — N401 Enlarged prostate with lower urinary tract symptoms: Principal | ICD-10-CM

## 2016-06-14 ENCOUNTER — Other Ambulatory Visit: Payer: Self-pay

## 2016-06-14 DIAGNOSIS — Z87898 Personal history of other specified conditions: Secondary | ICD-10-CM

## 2016-06-14 DIAGNOSIS — R972 Elevated prostate specific antigen [PSA]: Secondary | ICD-10-CM

## 2016-06-15 ENCOUNTER — Other Ambulatory Visit: Payer: Medicare Other

## 2016-06-15 DIAGNOSIS — R972 Elevated prostate specific antigen [PSA]: Secondary | ICD-10-CM

## 2016-06-16 LAB — PSA: Prostate Specific Ag, Serum: 1.5 ng/mL (ref 0.0–4.0)

## 2016-06-21 ENCOUNTER — Encounter: Payer: Self-pay | Admitting: Urology

## 2016-06-21 ENCOUNTER — Ambulatory Visit (INDEPENDENT_AMBULATORY_CARE_PROVIDER_SITE_OTHER): Payer: Medicare Other | Admitting: Urology

## 2016-06-21 VITALS — BP 151/78 | HR 69 | Ht 72.0 in | Wt 181.0 lb

## 2016-06-21 DIAGNOSIS — Z87898 Personal history of other specified conditions: Secondary | ICD-10-CM

## 2016-06-21 DIAGNOSIS — N400 Enlarged prostate without lower urinary tract symptoms: Secondary | ICD-10-CM | POA: Diagnosis not present

## 2016-06-21 LAB — BLADDER SCAN AMB NON-IMAGING: Scan Result: 122

## 2016-06-21 NOTE — Progress Notes (Signed)
1:02 PM   Gregory Griffin 03/31/1941 [id number]  Referring provider: Lauro RegulusMarshall W Anderson, MD [address] [facility] [place], [place] [address]  Chief Complaint  Patient presents with  . Benign Prostatic Hypertrophy    6 month follow up   . Elevated PSA    HPI: Patient is a 76 year old Caucasian male with a history of elevated PSA and BPH with LUTS who presents today for 6 month follow-up.    History of elevated PSA Patient was found to have a PSA of 6.3 ng/mL on 06/19/2014.   He started finasteride in February 2016.  His recent PSA was 1.5 ng/mL on 06/15/2016.     BPH WITH LUTS His IPSS score today is 6, which is mild lower urinary tract symptomatology. He is pleased with his quality life due to his urinary symptoms.  His PVR today is 122 mL.  His previous IPSS score was 2/1.  He has a prior history of urinary retention, but he is voiding well since he started tamsulosin and finasteride.   He is experiencing urgency.  He denies any dysuria, hematuria or suprapubic pain.   He also denies any recent fevers, chills, nausea or vomiting.   He does not have a family history of PCa.     IPSS    Row Name 06/21/16 1100         International Prostate Symptom Score   How often have you had the sensation of not emptying your bladder? Not at All     How often have you had to urinate less than every two hours? Not at All     How often have you found you stopped and started again several times when you urinated? Less than 1 in 5 times     How often have you found it difficult to postpone urination? About half the time     How often have you had a weak urinary stream? Less than 1 in 5 times     How often have you had to strain to start urination? Not at All     How many times did you typically get up at night to urinate? 1 Time     Total IPSS Score 6       Quality of Life due to urinary symptoms   If you were to spend the rest of your life with your urinary  condition just the way it is now how would you feel about that? Pleased        Score:  1-7 Mild 8-19 Moderate 20-35 Severe   PMH: Past Medical History:  Diagnosis Date  . AV block, 2nd degree   . Benign paroxysmal positional vertigo   . BPH (benign prostatic hyperplasia)   . CAD (coronary artery disease)   . Chronic bronchitis (HCC)   . Diabetes mellitus, type II (HCC)   . GERD (gastroesophageal reflux disease)   . HLD (hyperlipidemia)   . HTN (hypertension)   . OSA (obstructive sleep apnea)   . SOB (shortness of breath)   . Urinary retention   . Valvular heart disease     Surgical History: Past Surgical History:  Procedure Laterality Date  . BRAIN SURGERY  2009  . CATARACT EXTRACTION Right 2014   Left 2015  . COLECTOMY  06/2011  . REPAIR DURAL / CSF LEAK  2009  . ROTATOR CUFF REPAIR Left    x 2  . TONSILLECTOMY      Home Medications:  Allergies as of 06/21/2016      Reactions   Percocet [oxycodone-acetaminophen]       Medication List       Accurate as of 06/21/16 11:59 PM. Always use your most recent med list.          amLODipine 5 MG tablet Commonly known as:  NORVASC Take 5 mg by mouth daily.   aspirin EC 81 MG tablet Take by mouth.   buPROPion 150 MG 12 hr tablet Commonly known as:  ZYBAN Take 150 mg by mouth 2 (two) times daily.   buPROPion 150 MG 12 hr tablet Commonly known as:  WELLBUTRIN SR Reported on 06/22/2015   carbidopa-levodopa 25-100 MG tablet Commonly known as:  SINEMET IR take 1/2 tablet three times daily   CENTRUM SILVER ADULT 50+ PO Take by mouth.   docusate sodium 100 MG capsule Commonly known as:  COLACE Take by mouth.   finasteride 5 MG tablet Commonly known as:  PROSCAR TAKE 1 TABLET BY MOUTH EVERY DAY   FOLIC ACID PO Take by mouth.   furosemide 20 MG tablet Commonly known as:  LASIX   gabapentin 100 MG capsule Commonly known as:  NEURONTIN   lisinopril-hydrochlorothiazide 20-25 MG tablet Commonly known  as:  PRINZIDE,ZESTORETIC Take 1 tablet by mouth daily.   omeprazole 20 MG capsule Commonly known as:  PRILOSEC Take 20 mg by mouth daily.   potassium chloride 10 MEQ tablet Commonly known as:  K-DUR TK 1 T PO D PRN WITH FLUID PILL   simvastatin 20 MG tablet Commonly known as:  ZOCOR Take 20 mg by mouth daily.   tamsulosin 0.4 MG Caps capsule Commonly known as:  FLOMAX TAKE ONE CAPSULE BY MOUTH DAILY       Allergies:  Allergies  Allergen Reactions  . Percocet [Oxycodone-Acetaminophen]     Family History: Family History  Problem Relation Age of Onset  . Stroke Father   . Epilepsy Father   . Heart attack Mother   . Blindness Mother   . Kidney disease Neg Hx   . Prostate cancer Neg Hx   . Kidney cancer Neg Hx   . Bladder Cancer Neg Hx     Social History:  reports that he has quit smoking. He has never used smokeless tobacco. He reports that he drinks alcohol. He reports that he does not use drugs.  ROS: UROLOGY Frequent Urination?: No Hard to postpone urination?: Yes Burning/pain with urination?: No Get up at night to urinate?: Yes Leakage of urine?: No Urine stream starts and stops?: No Trouble starting stream?: No Do you have to strain to urinate?: No Blood in urine?: No Urinary tract infection?: No Sexually transmitted disease?: No Injury to kidneys or bladder?: No Painful intercourse?: No Weak stream?: No Erection problems?: Yes Penile pain?: No  Gastrointestinal Nausea?: No Vomiting?: No Indigestion/heartburn?: No Diarrhea?: No Constipation?: No  Constitutional Fever: No Night sweats?: No Weight loss?: No Fatigue?: No  Skin Skin rash/lesions?: No Itching?: No  Eyes Blurred vision?: No Double vision?: No  Ears/Nose/Throat Sore throat?: No Sinus problems?: No  Hematologic/Lymphatic Swollen glands?: No Easy bruising?: No  Cardiovascular Leg swelling?: Yes Chest pain?: No  Respiratory Cough?: No Shortness of breath?:  Yes  Endocrine Excessive thirst?: No  Musculoskeletal Back pain?: No Joint pain?: No  Neurological Headaches?: No Dizziness?: No  Psychologic Depression?: Yes Anxiety?: Yes  Physical Exam: BP (!) 151/78   Pulse 69   Ht 6' (1.829 m)   Wt 181 lb (82.1  kg)   BMI 24.55 kg/m   GU: Patient with uncircumcised phallus. Foreskin easily retracted  Urethral meatus is patent.  No penile discharge. No penile lesions or rashes. Scrotum without lesions, cysts, rashes and/or edema.  Testicles are located scrotally bilaterally. No masses are appreciated in the testicles. Left and right epididymis are normal. Rectal: Patient with  normal sphincter tone. Perineum without scarring or rashes. No rectal masses are appreciated. Prostate is approximately 60 grams, no nodules are appreciated. Seminal vesicles are normal.   Laboratory Data: Lab Results  Component Value Date   WBC 12.6 (H) 05/06/2014   HGB 14.7 05/06/2014   HCT 44.8 05/06/2014   MCV 90 05/06/2014   PLT 225 05/06/2014   Lab Results  Component Value Date   CREATININE 1.64 (H) 05/06/2014   PSA History  2.6 ng/mL on 06/22/2015  1.8 ng/mL on 12/16/2015  1.5 ng/mL on 06/15/2016  Assessment & Plan:    1. History of elevated PSA:      - Patient's most recent PSA was 1.5 ng/mL on 06/15/2016  - RTC in 6 months for PSA and exam  - If PSA remains low, we will discuss extending time between visits or discontinuing screening  2.  BPH with LUTS  - IPSS score is 6/1, it is worsening  - Continue conservative management, avoiding bladder irritants and timed voiding's  - Continue tamsulosin 0.4 mg daily and finasteride 5 mg daily  - Most bothersome symptom is urgency - patient will continue to manage conservatively  - RTC in 6 months for IPSS, PSA and exam   Return in about 6 months (around 12/22/2016) for IPSS, PSA and exam.  Michiel Cowboy, [facility]  [facility] [address] [place], [place]  [address] [phone]

## 2016-10-11 ENCOUNTER — Other Ambulatory Visit: Payer: Self-pay | Admitting: Neurology

## 2016-10-11 DIAGNOSIS — G20 Parkinson's disease: Secondary | ICD-10-CM

## 2016-10-17 ENCOUNTER — Ambulatory Visit
Admission: RE | Admit: 2016-10-17 | Discharge: 2016-10-17 | Disposition: A | Discharge status: Home / Self Care | Payer: Medicare Other | Source: Ambulatory Visit | Attending: Neurology | Admitting: Neurology

## 2016-10-17 DIAGNOSIS — G20 Parkinson's disease: Secondary | ICD-10-CM | POA: Insufficient documentation

## 2016-10-17 DIAGNOSIS — G20A1 Parkinson's disease without dyskinesia, without mention of fluctuations: Secondary | ICD-10-CM

## 2016-10-17 DIAGNOSIS — F0280 Dementia in other diseases classified elsewhere without behavioral disturbance: Secondary | ICD-10-CM | POA: Diagnosis not present

## 2016-10-17 DIAGNOSIS — Z8673 Personal history of transient ischemic attack (TIA), and cerebral infarction without residual deficits: Secondary | ICD-10-CM | POA: Diagnosis not present

## 2016-10-25 ENCOUNTER — Emergency Department
Admission: EM | Admit: 2016-10-25 | Discharge: 2016-10-25 | Disposition: A | Discharge status: Home / Self Care | Payer: Medicare Other | Attending: Emergency Medicine | Admitting: Emergency Medicine

## 2016-10-25 ENCOUNTER — Encounter: Payer: Self-pay | Admitting: Emergency Medicine

## 2016-10-25 ENCOUNTER — Emergency Department: Payer: Medicare Other

## 2016-10-25 ENCOUNTER — Ambulatory Visit: Payer: Self-pay | Admitting: Internal Medicine

## 2016-10-25 DIAGNOSIS — G20 Parkinson's disease: Secondary | ICD-10-CM | POA: Insufficient documentation

## 2016-10-25 DIAGNOSIS — Z87891 Personal history of nicotine dependence: Secondary | ICD-10-CM | POA: Diagnosis not present

## 2016-10-25 DIAGNOSIS — J449 Chronic obstructive pulmonary disease, unspecified: Secondary | ICD-10-CM | POA: Diagnosis not present

## 2016-10-25 DIAGNOSIS — I10 Essential (primary) hypertension: Secondary | ICD-10-CM | POA: Diagnosis not present

## 2016-10-25 DIAGNOSIS — Z79899 Other long term (current) drug therapy: Secondary | ICD-10-CM | POA: Insufficient documentation

## 2016-10-25 DIAGNOSIS — R339 Retention of urine, unspecified: Secondary | ICD-10-CM | POA: Diagnosis not present

## 2016-10-25 DIAGNOSIS — Z7982 Long term (current) use of aspirin: Secondary | ICD-10-CM | POA: Diagnosis not present

## 2016-10-25 DIAGNOSIS — E119 Type 2 diabetes mellitus without complications: Secondary | ICD-10-CM | POA: Diagnosis not present

## 2016-10-25 DIAGNOSIS — K5900 Constipation, unspecified: Secondary | ICD-10-CM | POA: Diagnosis not present

## 2016-10-25 LAB — CBC
HCT: 42.5 % (ref 40.0–52.0)
Hemoglobin: 14.4 g/dL (ref 13.0–18.0)
MCH: 30.7 pg (ref 26.0–34.0)
MCHC: 34 g/dL (ref 32.0–36.0)
MCV: 90.3 fL (ref 80.0–100.0)
PLATELETS: 204 10*3/uL (ref 150–440)
RBC: 4.7 MIL/uL (ref 4.40–5.90)
RDW: 14.3 % (ref 11.5–14.5)
WBC: 11 10*3/uL — ABNORMAL HIGH (ref 3.8–10.6)

## 2016-10-25 LAB — URINALYSIS, COMPLETE (UACMP) WITH MICROSCOPIC
Bacteria, UA: NONE SEEN
Bilirubin Urine: NEGATIVE
Glucose, UA: NEGATIVE mg/dL
KETONES UR: NEGATIVE mg/dL
Leukocytes, UA: NEGATIVE
Nitrite: NEGATIVE
PH: 6 (ref 5.0–8.0)
PROTEIN: NEGATIVE mg/dL
Specific Gravity, Urine: 1.017 (ref 1.005–1.030)

## 2016-10-25 LAB — BASIC METABOLIC PANEL
Anion gap: 8 (ref 5–15)
BUN: 31 mg/dL — AB (ref 6–20)
CO2: 28 mmol/L (ref 22–32)
CREATININE: 1.33 mg/dL — AB (ref 0.61–1.24)
Calcium: 9.8 mg/dL (ref 8.9–10.3)
Chloride: 100 mmol/L — ABNORMAL LOW (ref 101–111)
GFR calc Af Amer: 60 mL/min — ABNORMAL LOW (ref >60)
GFR, EST NON AFRICAN AMERICAN: 51 mL/min — AB (ref >60)
GLUCOSE: 91 mg/dL (ref 65–99)
POTASSIUM: 3.4 mmol/L — AB (ref 3.5–5.1)
SODIUM: 136 mmol/L (ref 135–145)

## 2016-10-25 NOTE — ED Notes (Signed)
[facility], stated that a doctor had come to pt house but there was a note on the door saying [facility] on [facility]. Pt stated wife could talk to RN.

## 2016-10-25 NOTE — ED Triage Notes (Signed)
Pt to ED reporting constipation x 2 days with no help from laxative pill. Pt reports increased pressure today. Pt also has not voided in the past two days. PT denies pain in penis and reports last time he was able to urinate his urine was normal. Pt denies abd pain and denies nausea and vomiting. No fevers reported. No tenderness upon palpation.

## 2016-10-25 NOTE — Discharge Instructions (Signed)
Constipation: Take colace twice a day everyday. Take senna once a day at bedtime. Take daily probiotics. Drink plenty of fluids and eat a diet rich in fiber. If you go more than 3 days without a bowel movement, take 1 cap full of Miralax in the morning and one in the evening up to 5 days.   FOLEY CARE Catheter care  Always wash your hands before and after touching your catheter.  Check the area around the urethra for inflammation or signs of infection. Signs of infection include irritated, swollen, red, or tender skin, or pus around the catheter.  Clean the area around the catheter with soap and water two times a day. Dry with a clean towel afterward.  Do not apply powder or lotion to the skin around the catheter.  To empty the urine collection bag  Wash your hands with soap and water.  Without touching the drain spout, remove the spout from its sleeve at the bottom of the collection bag. Open the valve on the spout.  Let the urine flow out of the bag and into the toilet or a container. Do not let the tubing or drain spout touch anything.  After you empty the bag, clean the end of the drain spout with tissue and water. Close the valve and put the drain spout back into its sleeve at the bottom of the collection bag.  Wash your hands with soap and water.

## 2016-10-25 NOTE — ED Notes (Signed)
Pt states he can't remember his last BM, wife states a few days ago he had to be disimpacted. Pt states last urination was yest morning. Pt c/o of back pain and feeling like he needs to defecate. Pt states 2 years ago he had urination problems, had a foley inserted. Pt sees a urologist, states hx of prostate issues. Pt alert, oriented, does not appear in distress at this time.

## 2016-10-25 NOTE — ED Provider Notes (Addendum)
[facility] Emergency Department Provider Note  ____________________________________________  Time seen: Approximately 3:11 PM  I have reviewed the triage vital signs and the nursing notes.   HISTORY  Chief Complaint Constipation   HPI Gregory Griffin is a 76 y.o. male patient with a history of Parkinson's disease and urinary retention due to BPH who presents for evaluation of constipation and urinary retention. Patient has been suffering from constipation for a few months. He was disimpacted twice in the last week by a friend's nurse. Patient is only on Colace at home. Patient is complaining of moderate constant pressure in his rectal area but no abdominal pain. Also patient has been unable to urinate since last night. Patient has had urinary retention in the setting of constipation in the past requiring a Foley catheter. Patient's PCP believe the patient's symptoms are due to advanced Parkinson's disease. Patient denies abdominal pain, nausea vomiting, fever or chills, dysuria hematuria.No prior abdominal surgeries, history of SBO, or abdominal distention. Patient is passing flatus.  Past Medical History:  Diagnosis Date  . AV block, 2nd degree   . Benign paroxysmal positional vertigo   . BPH (benign prostatic hyperplasia)   . CAD (coronary artery disease)   . Chronic bronchitis (HCC)   . Diabetes mellitus, type II (HCC)   . GERD (gastroesophageal reflux disease)   . HLD (hyperlipidemia)   . HTN (hypertension)   . OSA (obstructive sleep apnea)   . SOB (shortness of breath)   . Urinary retention   . Valvular heart disease     Patient Active Problem List   Diagnosis Date Noted  . Benign essential hypertension 12/25/2014  . Elevated PSA 12/22/2014  . BPH with obstruction/lower urinary tract symptoms 12/22/2014  . Health care maintenance 11/20/2014  . Parkinson's disease (HCC) 10/03/2014  . Postoperative CSF leak 10/03/2014  . Moderate mitral  insufficiency 07/11/2014  . 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 CAD (coronary artery disease) 10/27/2013  . COPD (chronic obstructive pulmonary disease) (HCC) 10/27/2013  . DM (diabetes mellitus) (HCC) 10/27/2013  . OSA (obstructive sleep apnea) 10/27/2013  . Tremor 10/27/2013  . SOB (shortness of breath) 09/17/2013  . GERD (gastroesophageal reflux disease) 09/13/2013  . Mixed hyperlipidemia 09/13/2013  . Second degree AV block 09/13/2013    Past Surgical History:  Procedure Laterality Date  . BRAIN SURGERY  2009  . CATARACT EXTRACTION Right 2014   Left 2015  . COLECTOMY  06/2011  . REPAIR DURAL / CSF LEAK  2009  . ROTATOR CUFF REPAIR Left    x 2  . TONSILLECTOMY      Prior to Admission medications   Medication Sig Start Date End Date Taking? Authorizing Provider  amLODipine (NORVASC) 5 MG tablet Take 5 mg by mouth daily.    [provider]  aspirin EC 81 MG tablet Take by mouth.    [provider]  buPROPion [facility] SR) 150 MG 12 hr tablet Reported on 06/22/2015 08/29/14   [provider]  buPROPion (ZYBAN) 150 MG 12 hr tablet Take 150 mg by mouth 2 (two) times daily.    [provider]  carbidopa-levodopa (SINEMET IR) 25-100 MG per tablet take 1/2 tablet three times daily 10/03/14   [provider]  docusate sodium (COLACE) 100 MG capsule Take by mouth.    [provider]  finasteride (PROSCAR) 5 MG tablet TAKE 1 TABLET BY MOUTH EVERY DAY 03/21/16   Michiel Cowboy  A, PA-C  FOLIC ACID PO Take by mouth.    [provider]  furosemide (LASIX) 20 MG tablet  10/26/15   [provider]  gabapentin (NEURONTIN) 100 MG capsule  03/23/15   [provider]  lisinopril-hydrochlorothiazide (PRINZIDE,ZESTORETIC) 20-25 MG per tablet Take 1 tablet by mouth daily.    [provider]  Multiple Vitamins-Minerals (CENTRUM SILVER ADULT 50+ PO)  Take by mouth.    [provider]  omeprazole (PRILOSEC) 20 MG capsule Take 20 mg by mouth daily.    [provider]  potassium chloride (K-DUR) 10 MEQ tablet TK 1 T PO D PRN WITH FLUID PILL 10/26/15   [provider]  simvastatin (ZOCOR) 20 MG tablet Take 20 mg by mouth daily.    [provider]  tamsulosin (FLOMAX) 0.4 MG CAPS capsule TAKE ONE CAPSULE BY MOUTH DAILY 03/21/16   Marvel PlanMcGowan, Wellington HampshireShannon A, PA-C    Allergies Percocet [oxycodone-acetaminophen]  Family History  Problem Relation Age of Onset  . Stroke Father   . Epilepsy Father   . Heart attack Mother   . Blindness Mother   . Kidney disease Neg Hx   . Prostate cancer Neg Hx   . Kidney cancer Neg Hx   . Bladder Cancer Neg Hx     Social History Social History  Substance Use Topics  . Smoking status: Former Games developermoker  . Smokeless tobacco: Never Used     Comment: quit 7 years ago  . Alcohol use 0.0 oz/week    Review of Systems  Constitutional: Negative for fever. Eyes: Negative for visual changes. ENT: Negative for sore throat. Neck: No neck pain  Cardiovascular: Negative for chest pain. Respiratory: Negative for shortness of breath. Gastrointestinal: Negative for abdominal pain, vomiting or diarrhea. + constipation Genitourinary: Negative for dysuria. + urinary retention Musculoskeletal: Negative for back pain. Skin: Negative for rash. Neurological: Negative for headaches, weakness or numbness. Psych: No SI or HI  ____________________________________________   PHYSICAL EXAM:  VITAL SIGNS: ED Triage Vitals  Enc Vitals Group     BP 10/25/16 1300 111/77     Pulse Rate 10/25/16 1225 86     Resp 10/25/16 1225 16     Temp 10/25/16 1225 98.4 F (36.9 C)     Temp Source 10/25/16 1225 Oral     SpO2 10/25/16 1225 97 %     Weight 10/25/16 1227 163 lb (73.9 kg)     Height 10/25/16 1227 6' (1.829 m)     Head Circumference --      Peak Flow --      Pain Score 10/25/16 1227 10      Pain Loc --      Pain Edu? --      Excl. in GC? --     Constitutional: Alert and oriented. Well appearing and in no apparent distress. HEENT:      Head: Normocephalic and atraumatic.         Eyes: Conjunctivae are normal. Sclera is non-icteric.       Mouth/Throat: Mucous membranes are moist.       Neck: Supple with no signs of meningismus. Cardiovascular: Regular rate and rhythm. No murmurs, gallops, or rubs. 2+ symmetrical distal pulses are present in all extremities. No JVD. Respiratory: Normal respiratory effort. Lungs are clear to auscultation bilaterally. No wheezes, crackles, or rhonchi.  Gastrointestinal: Soft, non tender, and non distended with positive bowel sounds. No rebound or guarding. Genitourinary: No CVA tenderness.Rectal exam showing impacted soft stool which  was disimpacted per procedure note below Musculoskeletal: Nontender with normal range of motion in all extremities. No edema, cyanosis, or erythema of extremities. Neurologic: Normal speech and language. Face is symmetric. Moving all extremities. No gross focal neurologic deficits are appreciated. Skin: Skin is warm, dry and intact. No rash noted. Psychiatric: Mood and affect are normal. Speech and behavior are normal.  ____________________________________________   LABS (all labs ordered are listed, but only abnormal results are displayed)  Labs Reviewed  CBC - Abnormal; Notable for the following:       Result Value   WBC 11.0 (*)    All other components within normal limits  BASIC METABOLIC PANEL - Abnormal; Notable for the following:    Potassium 3.4 (*)    Chloride 100 (*)    BUN 31 (*)    Creatinine, Ser 1.33 (*)    GFR calc non Af Amer 51 (*)    GFR calc Af Amer 60 (*)    All other components within normal limits  URINALYSIS, COMPLETE (UACMP) WITH MICROSCOPIC - Abnormal; Notable for the following:    Color, Urine YELLOW (*)    APPearance CLEAR (*)    Hgb urine dipstick MODERATE (*)    Squamous  Epithelial / LPF 0-5 (*)    All other components within normal limits  URINE CULTURE   ____________________________________________  EKG  none ____________________________________________  RADIOLOGY  KUB: Moderately large stool burden in the RIGHT colon. Moderately large fecal impaction ____________________________________________   PROCEDURES  Procedure(s) performed:yes Procedures   ------------------------------------------------------------------------------------------------------------------- Fecal Disimpaction Procedure Note:  Performed by me:  Patient placed in the lateral recumbent position with knees drawn towards chest. Nurse present for patient support. Large amount of soft brown stool removed. No complications during procedure.   ------------------------------------------------------------------------------------------------------------------   Critical Care performed:  None ____________________________________________   INITIAL IMPRESSION / ASSESSMENT AND PLAN / ED COURSE  76 y.o. male patient with a history of Parkinson's disease and urinary retention due to BPH who presents for evaluation of constipation and urinary retention. Patient with no evidence of SBO, normal bowel sounds, no abdominal distention, no abdominal tenderness, no nausea or vomiting, patient's passing flatus. KUB showing moderate to large stool burden with fecal impaction. Patient was disimpacted per procedure note above. Patient continued to have urinary retention after disimpaction with bladder scan showing 700cc of urine. Foley will be placed. Labs will be checked to eval for evidence of kidney injury. Patient will be started on daily senna + colace + probiotics + miralax PRN for constipation. Serial abdominal exams with no tenderness. Patient very well appearing.   Clinical Course as of Oct 25 1616  Tue Oct 25, 2016  1556 Foley placed with 600cc of urine. Patient remains well appearing,  normal vitals, no abdominal pain or tenderness. No nausea or vomiting here. Labs with no evidence of kidney injury.  Patient will be dc home on bowel regimen, continue flomax and finesteride, f/u with Urology for TOV. Foley instructions given to patient and his wife. Recommend return to the emergency room if the patient has abdominal pain, nausea vomiting, fever, or dysuria.  [CV]  1616 I have noticed that it was documented an episode of HR in the 220s in patient's vitals at 1PM. Telemetry records were reviewed by me with no evidence of tachyarrhythmia during his stay.   [CV]    Clinical Course User Index [CV] Nita Sickle, MD    Pertinent labs & imaging results that were available during my care of the  patient were reviewed by me and considered in my medical decision making (see chart for details).    ____________________________________________   FINAL CLINICAL IMPRESSION(S) / ED DIAGNOSES  Final diagnoses:  Constipation, unspecified constipation type  Urinary retention      NEW MEDICATIONS STARTED DURING THIS VISIT:  New Prescriptions   No medications on file     Note:  This document was prepared using Dragon voice recognition software and may include unintentional dictation errors.    Don Perking, [place], MD 10/25/16 1914    Nita Sickle, MD 10/25/16 [phone]

## 2016-10-25 NOTE — ED Notes (Signed)
Per Carollee HerterShannon RN bladder scan in triage showed over 800ml of urine in bladder.

## 2016-10-25 NOTE — ED Notes (Signed)
Dr. Don PerkingVeronese and this RN disimpacted pt of a large amount of stool. Pt has small blisters on bottom (L and R), wife states they are much more healed than prior and they have been seeing a doctor to help with healing. Pt tolerated procedure well.

## 2016-10-26 ENCOUNTER — Telehealth: Payer: Self-pay | Admitting: Urology

## 2016-10-26 NOTE — Telephone Encounter (Signed)
App made for 11-01-16 at 8:30 patient was made aware  Gregory DusterMichelle

## 2016-10-26 NOTE — Telephone Encounter (Signed)
-----   Message from Harle BattiestShannon A McGowan, PA-C sent at 10/25/2016  4:16 PM EDT ----- Regarding: FW: Follow up Would you schedule an appointment for him? ----- Message ----- From: Nita SickleVeronese, [place], MD Sent: 10/25/2016   4:03 PM To: Harle BattiestShannon A McGowan, PA-C Subject: Follow up                                      Dr. Marvel PlanMcGowan,  I had the pleasure of seeing your patient Mr. Rozycki in the emergency room for constipation and urinary retention. Even after disimpaction patient's urinary retention remained and a Foley catheter was placed. He was sent home to continue finasteride and flomax and I recommended close follow up in 5-7 days for trial of void with you. I would appreciated if you could ensure follow up for him.  Thanks you for your time.  Sincerely,  Nita Sicklearolina Veronese, MD Emergency medicine physician, [facility]

## 2016-10-27 LAB — URINE CULTURE: Culture: NO GROWTH

## 2016-11-01 ENCOUNTER — Ambulatory Visit: Payer: Medicare Other | Admitting: Urology

## 2016-11-01 NOTE — Progress Notes (Signed)
10:14 AM   Gregory Griffin 10/18/1940 [id number]  Referring provider: Lauro RegulusAnderson, Marshall W, MD [address] [facility] [place] [place], [place] [id number]  Chief Complaint  Patient presents with  . Urinary Retention    TOV    HPI: Patient is a 76 year old Caucasian male with a history of elevated PSA and BPH with LUTS who presents after being seen in the ED for urinary retention.      Urinary retention [facility] and was found to have associated urinary retention.  Foley catheter was placed and 700 cc of urine was returned.  He is now having BM's.    History of elevated PSA Patient was found to have a PSA of 6.3 ng/mL on 06/19/2014.   He started finasteride in February 2016.  His recent PSA was 1.5 ng/mL on 06/15/2016.     BPH WITH LUTS His previous IPSS score was 6/1.  His previous PVR was 122 mL.  He has a prior history of urinary retention, but he is voiding well since he started tamsulosin and finasteride.   He is experiencing urgency.  He denies any dysuria, hematuria or suprapubic pain.   He also denies any recent fevers, chills, nausea or vomiting.   He does not have a family history of PCa.    PMH: Past Medical History:  Diagnosis Date  . AV block, 2nd degree   . Benign paroxysmal positional vertigo   . BPH (benign prostatic hyperplasia)   . CAD (coronary artery disease)   . Chronic bronchitis (HCC)   . Diabetes mellitus, type II (HCC)   . GERD (gastroesophageal reflux disease)   . HLD (hyperlipidemia)   . HTN (hypertension)   . OSA (obstructive sleep apnea)   . SOB (shortness of breath)   . Urinary retention   . Valvular heart disease     Surgical History: Past Surgical History:  Procedure Laterality Date  . BRAIN SURGERY  2009  . CATARACT EXTRACTION Right 2014   Left 2015  . COLECTOMY  06/2011  . REPAIR DURAL / CSF LEAK  2009  . ROTATOR CUFF REPAIR Left    x 2  . TONSILLECTOMY      Home  Medications:  Allergies as of 11/02/2016      Reactions   Percocet [oxycodone-acetaminophen]       Medication List       Accurate as of 11/02/16 10:14 AM. Always use your most recent med list.          amLODipine 5 MG tablet Commonly known as:  NORVASC Take 5 mg by mouth daily.   aspirin EC 81 MG tablet Take by mouth.   buPROPion 150 MG 12 hr tablet Commonly known as:  ZYBAN Take 150 mg by mouth 2 (two) times daily.   buPROPion 150 MG 12 hr tablet Commonly known as:  WELLBUTRIN SR Reported on 06/22/2015   carbidopa-levodopa 25-100 MG tablet Commonly known as:  SINEMET IR take 1/2 tablet three times daily   CENTRUM SILVER ADULT 50+ PO Take by mouth.   docusate sodium 100 MG capsule Commonly known as:  COLACE Take by mouth.   finasteride 5 MG tablet Commonly known as:  PROSCAR TAKE 1 TABLET BY MOUTH EVERY DAY   FOLIC ACID PO Take by mouth.   furosemide 20 MG tablet Commonly known as:  LASIX   gabapentin 100 MG capsule Commonly known as:  NEURONTIN   lisinopril-hydrochlorothiazide 20-25 MG tablet  Commonly known as:  PRINZIDE,ZESTORETIC Take 1 tablet by mouth daily.   Magnesium Oxide 500 MG Tabs Take by mouth.   omeprazole 20 MG capsule Commonly known as:  PRILOSEC Take 20 mg by mouth daily.   potassium chloride 10 MEQ tablet Commonly known as:  K-DUR TK 1 T PO D PRN WITH FLUID PILL   rOPINIRole 0.5 MG tablet Commonly known as:  REQUIP Take by mouth.   simvastatin 20 MG tablet Commonly known as:  ZOCOR Take 20 mg by mouth daily.   tamsulosin 0.4 MG Caps capsule Commonly known as:  FLOMAX TAKE ONE CAPSULE BY MOUTH DAILY   torsemide 5 MG tablet Commonly known as:  DEMADEX Take by mouth.   TYLENOL PM EXTRA STRENGTH PO Take by mouth.       Allergies:  Allergies  Allergen Reactions  . Percocet [Oxycodone-Acetaminophen]     Family History: Family History  Problem Relation Age of Onset  . Stroke Father   . Epilepsy Father   . Heart  attack Mother   . Blindness Mother   . Kidney disease Neg Hx   . Prostate cancer Neg Hx   . Kidney cancer Neg Hx   . Bladder Cancer Neg Hx     Social History:  reports that he has quit smoking. He has never used smokeless tobacco. He reports that he drinks alcohol. He reports that he does not use drugs.  ROS: UROLOGY Frequent Urination?: No Hard to postpone urination?: No Burning/pain with urination?: No Get up at night to urinate?: No Leakage of urine?: No Urine stream starts and stops?: No Trouble starting stream?: No Do you have to strain to urinate?: No Blood in urine?: Yes Urinary tract infection?: No Sexually transmitted disease?: No Injury to kidneys or bladder?: No Painful intercourse?: No Weak stream?: No Erection problems?: No Penile pain?: No  Gastrointestinal Nausea?: No Vomiting?: No Indigestion/heartburn?: No Diarrhea?: No Constipation?: Yes  Constitutional Fever: No Night sweats?: No Weight loss?: No Fatigue?: No  Skin Skin rash/lesions?: No Itching?: No  Eyes Blurred vision?: No Double vision?: No  Ears/Nose/Throat Sore throat?: No Sinus problems?: No  Hematologic/Lymphatic Swollen glands?: No Easy bruising?: Yes  Cardiovascular Leg swelling?: No Chest pain?: No  Respiratory Cough?: No Shortness of breath?: No  Endocrine Excessive thirst?: No  Musculoskeletal Back pain?: No Joint pain?: No  Neurological Headaches?: No Dizziness?: No  Psychologic Depression?: No Anxiety?: No  Physical Exam: BP 112/71   Pulse 78   Ht 6' (1.829 m)   Wt 163 lb (73.9 kg)   BMI 22.11 kg/m   Constitutional: Well nourished. Alert and oriented, No acute distress. HEENT: [place] AT, moist mucus membranes. Trachea midline, no masses. Cardiovascular: No clubbing, cyanosis, or edema. Respiratory: Normal respiratory effort, no increased work of breathing. Skin: No rashes, bruises or suspicious lesions. Lymph: No cervical or inguinal  adenopathy. Neurologic: Grossly intact, no focal deficits, moving all 4 extremities. Psychiatric: Normal mood and affect.   Laboratory Data: Lab Results  Component Value Date   WBC 11.0 (H) 10/25/2016   HGB 14.4 10/25/2016   HCT 42.5 10/25/2016   MCV 90.3 10/25/2016   PLT 204 10/25/2016   Lab Results  Component Value Date   CREATININE 1.33 (H) 10/25/2016   PSA History  2.6 ng/mL on 06/22/2015  1.8 ng/mL on 12/16/2015  1.5 ng/mL on 06/15/2016  I reviewed the labs  Assessment & Plan:    1. Acute urinary retention:     - foley catheter removed  -  voiding trial today    - return if unable to urinate or experiencing suprapubic discomfort  - follow-up in one month for I PSS score, PVR and exam.  2. History of elevated PSA:      - Patient's most recent PSA was 1.5 ng/mL on 06/15/2016  - RTC in 1 month for PSA and exam  - If PSA remains low, we will discuss extending time between visits or discontinuing screening  3.  BPH with LUTS  - IPSS score is 6/1, it is worsening  - Continue conservative management, avoiding bladder irritants and timed voiding's  - Continue tamsulosin 0.4 mg daily and finasteride 5 mg daily  - Most bothersome symptom is urgency - patient will continue to manage conservatively  - RTC in 1 month for IPSS, PSA and exam   Return in about 1 month (around 12/03/2016) for IPSS, PSA, PVR and exam.  Michiel Cowboy, [facility]  [facility] [address], [address] [place], [place] [address] [phone]

## 2016-11-02 ENCOUNTER — Ambulatory Visit (INDEPENDENT_AMBULATORY_CARE_PROVIDER_SITE_OTHER): Payer: Medicare Other | Admitting: Urology

## 2016-11-02 ENCOUNTER — Telehealth: Payer: Self-pay | Admitting: Urology

## 2016-11-02 ENCOUNTER — Encounter: Payer: Self-pay | Admitting: Urology

## 2016-11-02 VITALS — BP 112/71 | HR 78 | Ht 72.0 in | Wt 163.0 lb

## 2016-11-02 DIAGNOSIS — R338 Other retention of urine: Secondary | ICD-10-CM | POA: Diagnosis not present

## 2016-11-02 DIAGNOSIS — N401 Enlarged prostate with lower urinary tract symptoms: Secondary | ICD-10-CM | POA: Diagnosis not present

## 2016-11-02 DIAGNOSIS — N138 Other obstructive and reflux uropathy: Secondary | ICD-10-CM

## 2016-11-02 DIAGNOSIS — Z87898 Personal history of other specified conditions: Secondary | ICD-10-CM | POA: Diagnosis not present

## 2016-11-02 NOTE — Telephone Encounter (Signed)
Spoke with pt wife in reference to medications. Wife requested several medications to be removed. Medications of norvasc, zyban, sinemet, gabapentin, and demadex were removed.

## 2016-11-02 NOTE — Telephone Encounter (Signed)
Pt saw Carollee HerterShannon this morning.  When he got home, his meds were incorrect and wife would like someone to call back and go over those with them.  [phone]

## 2016-11-02 NOTE — Progress Notes (Signed)
Catheter Removal  Patient is present today for a catheter removal.  8ml of water was drained from the balloon. A 16FR foley cath was removed from the bladder no complications were noted . Patient tolerated well.  Preformed by: Dallas Schimkeamona Williams CMA  Follow up/ Additional notes:  Come back to office by 3:00 pm if can not urinate on own.

## 2016-12-20 ENCOUNTER — Other Ambulatory Visit: Payer: Medicare Other

## 2016-12-22 ENCOUNTER — Ambulatory Visit: Payer: Medicare Other | Admitting: Urology

## 2017-01-10 ENCOUNTER — Ambulatory Visit: Payer: Medicare Other | Admitting: Urology

## 2017-01-16 ENCOUNTER — Ambulatory Visit: Payer: Medicare Other | Admitting: Urology

## 2017-01-27 NOTE — Progress Notes (Signed)
10:33 PM   Gregory Griffin October 04, 1940 [id number]  Referring provider: Lauro Regulus, MD [address] [facility] [place], [place] [address]  Chief Complaint  Patient presents with  . Follow-up    6 month    HPI: Patient is a 76 year old Caucasian male with a history of elevated PSA and BPH with LUTS who presents after being seen in the ED for urinary retention.      Urinary retention Patient presented to [facility] ED for constipation and was found to have associated urinary retention.  Foley catheter was placed and 700 cc of urine was returned.  He is now having BM's.  He had a TOV on 11/02/2016.  His PVR today is 269 mL.  History of elevated PSA Patient was found to have a PSA of 6.3 ng/mL on 06/19/2014.   He started finasteride in February 2016.  His recent PSA was 1.5 ng/mL on 06/15/2016.   PSA is drawn today.    BPH WITH LUTS His IPSS score is 17/3.  His  PVR is 269 mL.  His previous I PSS score was 6/1.  His previous PVR was 122 mL.  He has a prior history of urinary retention, but he is voiding well since he started tamsulosin and finasteride.   He is experiencing urgency and nocturia x 3.  He denies any dysuria, hematuria or suprapubic pain.   He also denies any recent fevers, chills, nausea or vomiting.   He does not have a family history of PCa. IPSS    Row Name 01/30/17 1500         International Prostate Symptom Score   How often have you had the sensation of not emptying your bladder?  About half the time     How often have you had to urinate less than every two hours?  Less than half the time     How often have you found you stopped and started again several times when you urinated?  Less than 1 in 5 times     How often have you found it difficult to postpone urination?  About half the time     How often have you had a weak urinary stream?  About half the time     How often have you had to strain to start urination?  Less than half the  time     How many times did you typically get up at night to urinate?  3 Times     Total IPSS Score  17       Quality of Life due to urinary symptoms   If you were to spend the rest of your life with your urinary condition just the way it is now how would you feel about that?  Mixed         PMH: Past Medical History:  Diagnosis Date  . AV block, 2nd degree   . Benign paroxysmal positional vertigo   . BPH (benign prostatic hyperplasia)   . CAD (coronary artery disease)   . Chronic bronchitis (HCC)   . Diabetes mellitus, type II (HCC)   . GERD (gastroesophageal reflux disease)   . HLD (hyperlipidemia)   . HTN (hypertension)   . OSA (obstructive sleep apnea)   . SOB (shortness of breath)   . Urinary retention   . Valvular heart disease     Surgical History: Past Surgical History:  Procedure Laterality Date  . BRAIN SURGERY  2009  .  CATARACT EXTRACTION Right 2014   Left 2015  . COLECTOMY  06/2011  . REPAIR DURAL / CSF LEAK  2009  . ROTATOR CUFF REPAIR Left    x 2  . TONSILLECTOMY      Home Medications:  Allergies as of 01/30/2017      Reactions   Percocet [oxycodone-acetaminophen]       Medication List        Accurate as of 01/30/17 11:59 PM. Always use your most recent med list.          aspirin EC 81 MG tablet Take by mouth.   buPROPion 150 MG 12 hr tablet Commonly known as:  WELLBUTRIN SR Reported on 06/22/2015   CENTRUM SILVER ADULT 50+ PO Take by mouth.   docusate sodium 100 MG capsule Commonly known as:  COLACE Take by mouth.   feeding supplement (GLUCERNA SHAKE) Liqd Take 237 mLs by mouth 3 (three) times daily between meals.   finasteride 5 MG tablet Commonly known as:  PROSCAR TAKE 1 TABLET BY MOUTH EVERY DAY   FOLIC ACID PO Take by mouth.   furosemide 20 MG tablet Commonly known as:  LASIX   lactulose 10 GM/15ML solution Commonly known as:  CHRONULAC Take by mouth 3 (three) times daily.   lisinopril-hydrochlorothiazide 20-25 MG  tablet Commonly known as:  PRINZIDE,ZESTORETIC Take 1 tablet by mouth daily.   Magnesium Oxide 500 MG Tabs Take by mouth.   omeprazole 20 MG capsule Commonly known as:  PRILOSEC Take 20 mg by mouth daily.   POLYETHYLENE GLYCOL 3350 PO Take by mouth.   potassium chloride 10 MEQ tablet Commonly known as:  K-DUR TK 1 T PO D PRN WITH FLUID PILL   rOPINIRole 0.5 MG tablet Commonly known as:  REQUIP Take by mouth.   simvastatin 20 MG tablet Commonly known as:  ZOCOR Take 20 mg by mouth daily.   tamsulosin 0.4 MG Caps capsule Commonly known as:  FLOMAX TAKE ONE CAPSULE BY MOUTH DAILY   TYLENOL PM EXTRA STRENGTH PO Take by mouth.       Allergies:  Allergies  Allergen Reactions  . Percocet [Oxycodone-Acetaminophen]     Family History: Family History  Problem Relation Age of Onset  . Stroke Father   . Epilepsy Father   . Heart attack Mother   . Blindness Mother   . Kidney disease Neg Hx   . Prostate cancer Neg Hx   . Kidney cancer Neg Hx   . Bladder Cancer Neg Hx     Social History:  reports that he has quit smoking. he has never used smokeless tobacco. He reports that he drinks alcohol. He reports that he does not use drugs.  ROS: UROLOGY Frequent Urination?: No Hard to postpone urination?: Yes Burning/pain with urination?: No Get up at night to urinate?: Yes Leakage of urine?: No Urine stream starts and stops?: No Trouble starting stream?: No Do you have to strain to urinate?: No Blood in urine?: No Urinary tract infection?: No Sexually transmitted disease?: No Injury to kidneys or bladder?: No Painful intercourse?: No Weak stream?: No Erection problems?: No Penile pain?: No  Gastrointestinal Nausea?: No Vomiting?: No Indigestion/heartburn?: No Diarrhea?: No Constipation?: No  Constitutional Fever: No Night sweats?: No Weight loss?: No Fatigue?: No  Skin Skin rash/lesions?: No Itching?: No  Eyes Blurred vision?: No Double vision?:  No  Ears/Nose/Throat Sore throat?: No Sinus problems?: No  Hematologic/Lymphatic Swollen glands?: No Easy bruising?: No  Cardiovascular Leg swelling?: No Chest pain?:  No  Respiratory Cough?: No Shortness of breath?: No  Endocrine Excessive thirst?: No  Musculoskeletal Back pain?: No Joint pain?: No  Neurological Headaches?: No Dizziness?: No  Psychologic Depression?: No Anxiety?: No  Physical Exam: BP (!) 175/94 (BP Location: Left Arm, Patient Position: Sitting, Cuff Size: Normal)   Pulse 73   Ht 6' (1.829 m)   Wt 185 lb (83.9 kg) Comment: per patient; wheelchair bound  BMI 25.09 kg/m   Constitutional: Well nourished. Alert and oriented, No acute distress. HEENT: [place] AT, moist mucus membranes. Trachea midline, no masses. Cardiovascular: No clubbing, cyanosis, or edema. Respiratory: Normal respiratory effort, no increased work of breathing. Skin: No rashes, bruises or suspicious lesions. Lymph: No cervical or inguinal adenopathy. Neurologic: Grossly intact, no focal deficits, moving all 4 extremities. Psychiatric: Normal mood and affect.   Laboratory Data: Lab Results  Component Value Date   WBC 11.0 (H) 10/25/2016   HGB 14.4 10/25/2016   HCT 42.5 10/25/2016   MCV 90.3 10/25/2016   PLT 204 10/25/2016   Lab Results  Component Value Date   CREATININE 1.33 (H) 10/25/2016   PSA History  2.6 ng/mL on 06/22/2015  1.8 ng/mL on 12/16/2015  1.5 ng/mL on 06/15/2016  I reviewed the labs  Assessment & Plan:    1. History of urinary retention:     - urinating without complaints  - residuals are moderate -hesitate to start an OAB agent for his urge incontinence   2. History of elevated PSA:      - Patient's most recent PSA was 1.5 ng/mL on 06/15/2016  - PSA today  - If PSA remains low, we will discuss extending time between visits or discontinuing screening  3.  BPH with LUTS  - IPSS score is 17/3, it is worsening  - Continue conservative  management, avoiding bladder irritants and timed voiding's  - Continue tamsulosin 0.4 mg daily and finasteride 5 mg daily  - Most bothersome symptom is urgency   - RTC pending PSA  4. Urge incontinence  -Patient with moderate residuals  - explained the PTNS provides treatment by indirectly providing electrical stimulation to the nerves responsible for bladder and pelvic floor function - a needle electrode generates an adjustable electrical pulse that travels to the sacral plexus via the tibial nerve which is located in the ankle, among other functions, the sacral nerve plexus regulates bladder and pelvic floor function - treatment protocol requires once-a-week treatments for 12 weeks, 30 minutes per session and many patients begin to see improvements by the 6th treatment. Patients who respond to treatment may require occasional treatments (~ once every 3 weeks) to sustain improvements. PTNS is a low-risk procedure. The most common side-effects with PTNS treatment are temporary and minor, resulting from the placement of the needle electrode. They include minor bleeding, mild pain and skin inflammation and patients have seen up to an 80% success rate with this form of treatment  - RTC for PTNS   Return for PTNS.  Michiel CowboySHANNON Astor Gentle, PA-C  [facility] [address], [address] [place], [place] [address] [phone]

## 2017-01-30 ENCOUNTER — Telehealth: Payer: Self-pay | Admitting: Urology

## 2017-01-30 ENCOUNTER — Encounter: Payer: Self-pay | Admitting: Urology

## 2017-01-30 ENCOUNTER — Ambulatory Visit (INDEPENDENT_AMBULATORY_CARE_PROVIDER_SITE_OTHER): Payer: Medicare Other | Admitting: Urology

## 2017-01-30 VITALS — BP 175/94 | HR 73 | Ht 72.0 in | Wt 185.0 lb

## 2017-01-30 DIAGNOSIS — Z87898 Personal history of other specified conditions: Secondary | ICD-10-CM

## 2017-01-30 DIAGNOSIS — N138 Other obstructive and reflux uropathy: Secondary | ICD-10-CM | POA: Diagnosis not present

## 2017-01-30 DIAGNOSIS — N401 Enlarged prostate with lower urinary tract symptoms: Secondary | ICD-10-CM | POA: Diagnosis not present

## 2017-01-30 DIAGNOSIS — N3941 Urge incontinence: Secondary | ICD-10-CM

## 2017-01-30 LAB — BLADDER SCAN AMB NON-IMAGING: SCAN RESULT: 269

## 2017-01-30 NOTE — Telephone Encounter (Signed)
I would like to get this patient scheduled for PTNS.  His situation is complicated as he is also receiving VA benefits. The patient's wife will call with the VA card. She is not certain whether or not the VA would cover PTNS treatments or it would, out of his Medicare.

## 2017-01-31 ENCOUNTER — Telehealth: Payer: Self-pay | Admitting: Family Medicine

## 2017-01-31 LAB — PSA: Prostate Specific Ag, Serum: 1.7 ng/mL (ref 0.0–4.0)

## 2017-01-31 NOTE — Telephone Encounter (Signed)
-----   Message from Harle BattiestShannon A McGowan, PA-C sent at 01/31/2017  8:30 AM EDT ----- Please let Mr. Gregory Griffin know that his PSA is normal at 1.7.  Also, let the [name] know that we are working on finding out the coverage for the PTNS.  Did they find the benefit VA card?

## 2017-01-31 NOTE — Telephone Encounter (Signed)
Patient notified and he did find his VA Benefits card.

## 2017-02-03 NOTE — Telephone Encounter (Signed)
In the system, patient only has Medicare and [employer] policy information .  Based on this information, prior authorization is not required for PTNS (CPT [id number]), 12 visits.    We need more information from the patient about [place].

## 2017-02-03 NOTE — Telephone Encounter (Signed)
Can you set this pt up with his 12 PTNS appts?

## 2017-02-14 ENCOUNTER — Ambulatory Visit: Payer: Medicare Other | Admitting: Urology

## 2017-02-21 ENCOUNTER — Ambulatory Visit: Payer: Medicare Other | Admitting: Urology

## 2017-02-28 ENCOUNTER — Ambulatory Visit: Payer: Medicare Other | Admitting: Urology

## 2017-03-07 ENCOUNTER — Ambulatory Visit: Payer: Medicare Other | Admitting: Urology

## 2017-03-13 NOTE — Progress Notes (Deleted)
Chief Complaint: No chief complaint on file.    HPI: Patient is a 76 year old [place] male with a history of urinary retention, history of elevated PSA and BPH with LU TS who presents today to start 12 weekly treatments of PTNS.  This will be 1 of 12.    Urinary retention Patient [facility] for const[facility] and was found to have associated urinary retention.  Foley catheter was placed and 700 cc of urine was returned.  He is now having BM's.  He had a TOV on 11/02/2016.  His PVR upon return was 269 mL.  History of elevated PSA Patient was found to have a PSA of 6.3 ng/mL on 06/19/2014.   He started finasteride in February 2016.  His recent PSA was 1.7ng/mL on 01/2017.     BPH WITH LUTS His IPSS score is 17/3.  His  PVR is 269 mL.  His previous I PSS score was 6/1.  His previous PVR was 122 mL.  He has a prior history of urinary retention, but he is voiding well since he started tamsulosin and finasteride.   He is experiencing urgency and nocturia x 3.  He denies any dysuria, hematuria or suprapubic pain.   He also denies any recent fevers, chills, nausea or vomiting.   He does not have a family history of PCa.       IPSS    Row Name 01/30/17 1500             International Prostate Symptom Score   How often have you had the sensation of not emptying your bladder?  About half the time     How often have you had to urinate less than every two hours?  Less than half the time     How often have you found you stopped and started again several times when you urinated?  Less than 1 in 5 times     How often have you found it difficult to postpone urination?  About half the time     How often have you had a weak urinary stream?  About half the time     How often have you had to strain to start urination?  Less than half the time     How many times did you typically get up at night to urinate?  3 Times     Total IPSS Score  17            Quality of Life due to urinary symptoms   If you were to spend the rest of your life with your urinary condition just the way it is now how would you feel about that?  Mixed       Patient is not a candidate for OAB medications as he has a history of retention a Parkinson's.      Contraindications present for PTNS      Pacemaker      Implantable defibrillator      History of abnormal bleeding      History of neuropathies or nerve damage  Discussed with patient possible complications of procedure, such as discomfort, bleeding at insertion/stimulation site, procedure consent signed  Patient goals:     ***  Reemphasized that most patient's will see benefit by the 8th week of treatment, but about 10 to 20% of patient may be late responders.  If they happen to be late responders, it is important to continue with the therapy beyond the 12  weekly treatments as many of those patient find benefit.    Treatment #  Baseline   # 1   # 2   # 3   # 4   # 5   # 6  Overall improvement  Day time voids          Night time voids          Incontinence episodes          Urgency             PMH: Past Medical History:  Diagnosis Date  . AV block, 2nd degree   . Benign paroxysmal positional vertigo   . BPH (benign prostatic hyperplasia)   . CAD (coronary artery disease)   . Chronic bronchitis (HCC)   . Diabetes mellitus, type II (HCC)   . GERD (gastroesophageal reflux disease)   . HLD (hyperlipidemia)   . HTN (hypertension)   . OSA (obstructive sleep apnea)   . SOB (shortness of breath)   . Urinary retention   . Valvular heart disease     Surgical History: Past Surgical History:  Procedure Laterality Date  . BRAIN SURGERY  2009  . CATARACT EXTRACTION Right 2014   Left 2015  . COLECTOMY  06/2011  . REPAIR DURAL / CSF LEAK  2009  . ROTATOR CUFF REPAIR Left    x 2  . TONSILLECTOMY      Home Medications:  Allergies as of 03/14/2017      Reactions   Percocet  [oxycodone-acetaminophen]       Medication List        Accurate as of 03/13/17 11:57 AM. Always use your most recent med list.          aspirin EC 81 MG tablet Take by mouth.   buPROPion 150 MG 12 hr tablet Commonly known as:  WELLBUTRIN SR Reported on 06/22/2015   CENTRUM SILVER ADULT 50+ PO Take by mouth.   docusate sodium 100 MG capsule Commonly known as:  COLACE Take by mouth.   feeding supplement (GLUCERNA SHAKE) Liqd Take 237 mLs by mouth 3 (three) times daily between meals.   finasteride 5 MG tablet Commonly known as:  PROSCAR TAKE 1 TABLET BY MOUTH EVERY DAY   FOLIC ACID PO Take by mouth.   furosemide 20 MG tablet Commonly known as:  LASIX   lactulose 10 GM/15ML solution Commonly known as:  CHRONULAC Take by mouth 3 (three) times daily.   lisinopril-hydrochlorothiazide 20-25 MG tablet Commonly known as:  PRINZIDE,ZESTORETIC Take 1 tablet by mouth daily.   Magnesium Oxide 500 MG Tabs Take by mouth.   omeprazole 20 MG capsule Commonly known as:  PRILOSEC Take 20 mg by mouth daily.   POLYETHYLENE GLYCOL 3350 PO Take by mouth.   potassium chloride 10 MEQ tablet Commonly known as:  K-DUR TK 1 T PO D PRN WITH FLUID PILL   rOPINIRole 0.5 MG tablet Commonly known as:  REQUIP Take by mouth.   simvastatin 20 MG tablet Commonly known as:  ZOCOR Take 20 mg by mouth daily.   tamsulosin 0.4 MG Caps capsule Commonly known as:  FLOMAX TAKE ONE CAPSULE BY MOUTH DAILY   TYLENOL PM EXTRA STRENGTH PO Take by mouth.       Allergies:  Allergies  Allergen Reactions  . Percocet [Oxycodone-Acetaminophen]     Family History: Family History  Problem Relation Age of Onset  . Stroke Father   . Epilepsy Father   . Heart attack Mother   .  Blindness Mother   . Kidney disease Neg Hx   . Prostate cancer Neg Hx   . Kidney cancer Neg Hx   . Bladder Cancer Neg Hx     Social History:  reports that he has quit smoking. he has never used smokeless  tobacco. He reports that he drinks alcohol. He reports that he does not use drugs.  ROS:                                         Physical Exam: There were no vitals taken for this visit.  Constitutional: Well nourished. Alert and oriented, No acute distress. HEENT: [place] AT, moist mucus membranes. Trachea midline, no masses. Cardiovascular: No clubbing, cyanosis, or edema. Respiratory: Normal respiratory effort, no increased work of breathing. Skin: No rashes, bruises or suspicious lesions. Lymph: No cervical or inguinal adenopathy. Neurologic: Grossly intact, no focal deficits, moving all 4 extremities. Psychiatric: Normal mood and affect.  Laboratory Data: Lab Results  Component Value Date   WBC 11.0 (H) 10/25/2016   HGB 14.4 10/25/2016   HCT 42.5 10/25/2016   MCV 90.3 10/25/2016   PLT 204 10/25/2016    Lab Results  Component Value Date   CREATININE 1.33 (H) 10/25/2016     Pertinent Imaging: ***  PTNS treatment: The needle electrode was inserted into the lower, inner aspect of the patient's *** leg. The surface electrode was placed on the inside arch of the foot on the treatment leg. The lead set was connected to the stimulator and the needle electrode clip was connected to the needle electrode. The stimulator that produces an adjustable electrical pulse that travels to the sacral nerve plexus via the tibial nerve was increased to *** and tell the patient received a ***.     Assessment & Plan:  ***  1. Urge incontinence  - # 1 of 12 PTNS completed today  Treatment Plan:  The needle electrode was removed without difficulty to the patient.  Patient tolerated the procedure for 30 minutes.  He will return next week for # *** out of 12 of their weekly PTNS treatment's    No Follow-up on file.  These notes generated with voice recognition software. I apologize for typographical errors.  Michiel CowboySHANNON Emili Mcloughlin, PA-C  [facility] [address], [address] [place], [place] [address] [phone]

## 2017-03-14 ENCOUNTER — Ambulatory Visit: Payer: Medicare Other | Admitting: Urology

## 2017-03-16 ENCOUNTER — Telehealth: Payer: Self-pay | Admitting: Urology

## 2017-03-16 NOTE — Telephone Encounter (Signed)
[facility] and [facility] asked why we are doing PTNS since pt is already wheelchair and home bound.  He wants to cancel all future PTNS treatments.

## 2017-03-19 NOTE — Progress Notes (Deleted)
Chief Complaint: No chief complaint on file.    HPI: Patient is a 76 year old [place] male with a history of urinary retention, history of elevated PSA and BPH with LU TS who presents today to start 12 weekly treatments of PTNS.  This will be 1 of 12.    Urinary retention Patient [facility] for constipation and was found to have associated urinary retention.  Foley catheter was placed and 700 cc of urine was returned.  He is now having BM's.  He had a TOV on 11/02/2016.  His PVR upon return was 269 mL.  History of elevated PSA Patient was found to have a PSA of 6.3 ng/mL on 06/19/2014.   He started finasteride in February 2016.  His recent PSA was 1.7ng/mL on 01/2017.     BPH WITH LUTS His IPSS score is 17/3.  His  PVR is 269 mL.  His previous I PSS score was 6/1.  His previous PVR was 122 mL.  He has a prior history of urinary retention, but he is voiding well since he started tamsulosin and finasteride.   He is experiencing urgency and nocturia x 3.  He denies any dysuria, hematuria or suprapubic pain.   He also denies any recent fevers, chills, nausea or vomiting.   He does not have a family history of PCa.       IPSS    Row Name 01/30/17 1500             International Prostate Symptom Score   How often have you had the sensation of not emptying your bladder?  About half the time     How often have you had to urinate less than every two hours?  Less than half the time     How often have you found you stopped and started again several times when you urinated?  Less than 1 in 5 times     How often have you found it difficult to postpone urination?  About half the time     How often have you had a weak urinary stream?  About half the time     How often have you had to strain to start urination?  Less than half the time     How many times did you typically get up at night to urinate?  3 Times     Total IPSS Score  17            Quality of Life due to urinary symptoms   If you were to spend the rest of your life with your urinary condition just the way it is now how would you feel about that?  Mixed       Patient is not a candidate for OAB medications as he has a history of retention a Parkinson's.      Contraindications present for PTNS      Pacemaker      Implantable defibrillator      History of abnormal bleeding      History of neuropathies or nerve damage  Discussed with patient possible complications of procedure, such as discomfort, bleeding at insertion/stimulation site, procedure consent signed  Patient goals:     ***  Reemphasized that most patient's will see benefit by the 8th week of treatment, but about 10 to 20% of patient may be late responders.  If they happen to be late responders, it is important to continue with the therapy beyond the 12  weekly treatments as many of those patient find benefit.    Treatment #  Baseline   # 1   # 2   # 3   # 4   # 5   # 6  Overall improvement  Day time voids          Night time voids          Incontinence episodes          Urgency             PMH: Past Medical History:  Diagnosis Date  . AV block, 2nd degree   . Benign paroxysmal positional vertigo   . BPH (benign prostatic hyperplasia)   . CAD (coronary artery disease)   . Chronic bronchitis (HCC)   . Diabetes mellitus, type II (HCC)   . GERD (gastroesophageal reflux disease)   . HLD (hyperlipidemia)   . HTN (hypertension)   . OSA (obstructive sleep apnea)   . SOB (shortness of breath)   . Urinary retention   . Valvular heart disease     Surgical History: Past Surgical History:  Procedure Laterality Date  . BRAIN SURGERY  2009  . CATARACT EXTRACTION Right 2014   Left 2015  . COLECTOMY  06/2011  . REPAIR DURAL / CSF LEAK  2009  . ROTATOR CUFF REPAIR Left    x 2  . TONSILLECTOMY      Home Medications:  Allergies as of 03/21/2017      Reactions   Percocet  [oxycodone-acetaminophen]       Medication List        Accurate as of 03/19/17  6:58 PM. Always use your most recent med list.          aspirin EC 81 MG tablet Take by mouth.   buPROPion 150 MG 12 hr tablet Commonly known as:  WELLBUTRIN SR Reported on 06/22/2015   CENTRUM SILVER ADULT 50+ PO Take by mouth.   docusate sodium 100 MG capsule Commonly known as:  COLACE Take by mouth.   feeding supplement (GLUCERNA SHAKE) Liqd Take 237 mLs by mouth 3 (three) times daily between meals.   finasteride 5 MG tablet Commonly known as:  PROSCAR TAKE 1 TABLET BY MOUTH EVERY DAY   FOLIC ACID PO Take by mouth.   furosemide 20 MG tablet Commonly known as:  LASIX   lactulose 10 GM/15ML solution Commonly known as:  CHRONULAC Take by mouth 3 (three) times daily.   lisinopril-hydrochlorothiazide 20-25 MG tablet Commonly known as:  PRINZIDE,ZESTORETIC Take 1 tablet by mouth daily.   Magnesium Oxide 500 MG Tabs Take by mouth.   omeprazole 20 MG capsule Commonly known as:  PRILOSEC Take 20 mg by mouth daily.   POLYETHYLENE GLYCOL 3350 PO Take by mouth.   potassium chloride 10 MEQ tablet Commonly known as:  K-DUR TK 1 T PO D PRN WITH FLUID PILL   rOPINIRole 0.5 MG tablet Commonly known as:  REQUIP Take by mouth.   simvastatin 20 MG tablet Commonly known as:  ZOCOR Take 20 mg by mouth daily.   tamsulosin 0.4 MG Caps capsule Commonly known as:  FLOMAX TAKE ONE CAPSULE BY MOUTH DAILY   TYLENOL PM EXTRA STRENGTH PO Take by mouth.       Allergies:  Allergies  Allergen Reactions  . Percocet [Oxycodone-Acetaminophen]     Family History: Family History  Problem Relation Age of Onset  . Stroke Father   . Epilepsy Father   . Heart attack  Mother   . Blindness Mother   . Kidney disease Neg Hx   . Prostate cancer Neg Hx   . Kidney cancer Neg Hx   . Bladder Cancer Neg Hx     Social History:  reports that he has quit smoking. he has never used smokeless  tobacco. He reports that he drinks alcohol. He reports that he does not use drugs.  ROS:                                         Physical Exam: There were no vitals taken for this visit.  Constitutional: Well nourished. Alert and oriented, No acute distress. HEENT: [place] AT, moist mucus membranes. Trachea midline, no masses. Cardiovascular: No clubbing, cyanosis, or edema. Respiratory: Normal respiratory effort, no increased work of breathing. Skin: No rashes, bruises or suspicious lesions. Lymph: No cervical or inguinal adenopathy. Neurologic: Grossly intact, no focal deficits, moving all 4 extremities. Psychiatric: Normal mood and affect.  Laboratory Data: Lab Results  Component Value Date   WBC 11.0 (H) 10/25/2016   HGB 14.4 10/25/2016   HCT 42.5 10/25/2016   MCV 90.3 10/25/2016   PLT 204 10/25/2016    Lab Results  Component Value Date   CREATININE 1.33 (H) 10/25/2016     Pertinent Imaging: ***  PTNS treatment: The needle electrode was inserted into the lower, inner aspect of the patient's *** leg. The surface electrode was placed on the inside arch of the foot on the treatment leg. The lead set was connected to the stimulator and the needle electrode clip was connected to the needle electrode. The stimulator that produces an adjustable electrical pulse that travels to the sacral nerve plexus via the tibial nerve was increased to *** and tell the patient received a ***.     Assessment & Plan:  ***  1. Urge incontinence  - # 1 of 12 PTNS completed today  Treatment Plan:  The needle electrode was removed without difficulty to the patient.  Patient tolerated the procedure for 30 minutes.  He will return next week for # *** out of 12 of their weekly PTNS treatment's    No Follow-up on file.  These notes generated with voice recognition software. I apologize for typographical errors.  Michiel CowboySHANNON Jaylon Boylen, PA-C  [facility] [address], [address] [place], [place] [address] [phone]

## 2017-03-21 ENCOUNTER — Ambulatory Visit: Payer: Medicare Other | Admitting: Urology

## 2017-03-21 NOTE — Telephone Encounter (Signed)
I am having the patient undergo PTNS treatments because he was complaining of incontinence and due to his large residuals and Parkinson's disease, medications will not work and might be dangerous.  If he is not wanting to pursue treatment for his incontinence and wants to discontinue the PTNS, that is fine with me.

## 2017-03-29 ENCOUNTER — Ambulatory Visit: Payer: Medicare Other

## 2017-04-02 ENCOUNTER — Emergency Department: Payer: Medicare Other

## 2017-04-02 ENCOUNTER — Emergency Department
Admission: EM | Admit: 2017-04-02 | Discharge: 2017-04-02 | Disposition: A | Discharge status: Home / Self Care | Payer: Medicare Other | Attending: Emergency Medicine | Admitting: Emergency Medicine

## 2017-04-02 ENCOUNTER — Encounter: Payer: Self-pay | Admitting: Emergency Medicine

## 2017-04-02 DIAGNOSIS — S022XXB Fracture of nasal bones, initial encounter for open fracture: Secondary | ICD-10-CM | POA: Diagnosis not present

## 2017-04-02 DIAGNOSIS — W2203XA Walked into furniture, initial encounter: Secondary | ICD-10-CM | POA: Diagnosis not present

## 2017-04-02 DIAGNOSIS — Y92003 Bedroom of unspecified non-institutional (private) residence as the place of occurrence of the external cause: Secondary | ICD-10-CM | POA: Diagnosis not present

## 2017-04-02 DIAGNOSIS — Z87891 Personal history of nicotine dependence: Secondary | ICD-10-CM | POA: Insufficient documentation

## 2017-04-02 DIAGNOSIS — S02401B Maxillary fracture, unspecified, initial encounter for open fracture: Secondary | ICD-10-CM | POA: Diagnosis not present

## 2017-04-02 DIAGNOSIS — S0231XB Fracture of orbital floor, right side, initial encounter for open fracture: Secondary | ICD-10-CM

## 2017-04-02 DIAGNOSIS — J449 Chronic obstructive pulmonary disease, unspecified: Secondary | ICD-10-CM | POA: Diagnosis not present

## 2017-04-02 DIAGNOSIS — Z79899 Other long term (current) drug therapy: Secondary | ICD-10-CM | POA: Insufficient documentation

## 2017-04-02 DIAGNOSIS — E119 Type 2 diabetes mellitus without complications: Secondary | ICD-10-CM | POA: Diagnosis not present

## 2017-04-02 DIAGNOSIS — Y939 Activity, unspecified: Secondary | ICD-10-CM | POA: Diagnosis not present

## 2017-04-02 DIAGNOSIS — G20 Parkinson's disease: Secondary | ICD-10-CM | POA: Insufficient documentation

## 2017-04-02 DIAGNOSIS — Z7982 Long term (current) use of aspirin: Secondary | ICD-10-CM | POA: Insufficient documentation

## 2017-04-02 DIAGNOSIS — Y999 Unspecified external cause status: Secondary | ICD-10-CM | POA: Insufficient documentation

## 2017-04-02 DIAGNOSIS — I10 Essential (primary) hypertension: Secondary | ICD-10-CM | POA: Diagnosis not present

## 2017-04-02 DIAGNOSIS — S0990XA Unspecified injury of head, initial encounter: Secondary | ICD-10-CM | POA: Diagnosis present

## 2017-04-02 DIAGNOSIS — I2510 Atherosclerotic heart disease of native coronary artery without angina pectoris: Secondary | ICD-10-CM | POA: Insufficient documentation

## 2017-04-02 DIAGNOSIS — W06XXXA Fall from bed, initial encounter: Secondary | ICD-10-CM | POA: Diagnosis not present

## 2017-04-02 DIAGNOSIS — R52 Pain, unspecified: Secondary | ICD-10-CM

## 2017-04-02 DIAGNOSIS — S61412A Laceration without foreign body of left hand, initial encounter: Secondary | ICD-10-CM | POA: Insufficient documentation

## 2017-04-02 MED ORDER — AMOXICILLIN-POT CLAVULANATE 875-125 MG PO TABS: 1.0000 | ORAL_TABLET | Freq: Two times a day (BID) | ORAL | 0 refills | Status: AC

## 2017-04-02 MED ORDER — TETANUS-DIPHTH-ACELL PERTUSSIS 5-2.5-18.5 LF-MCG/0.5 IM SUSP: 0.5000 mL | Freq: Once | INTRAMUSCULAR | Status: DC

## 2017-04-02 NOTE — ED Notes (Addendum)
EMS called for ride back home for pt. Pt given coke and informed of EMS transportation coming for him.

## 2017-04-02 NOTE — ED Notes (Signed)
Patient transported to X-ray 

## 2017-04-02 NOTE — ED Notes (Signed)
Pt's face cleaned up and small dressing applied to left hand

## 2017-04-02 NOTE — ED Provider Notes (Addendum)
[facility] Emergency Department Provider Note  ____________________________________________   First MD Initiated Contact with Patient 04/02/17 [phone]     (approximate)  I have reviewed the triage vital signs and the nursing notes.   HISTORY  Chief Complaint Fall   HPI Gregory Griffin is a 76 y.o. male who comes to the emergency department by EMS with right-sided facial trauma and left hand trauma after rolling out of bed this evening.  He was asleep and the next thing he knew he was waking up falling to the ground striking his right side of his face against his nightstand.  He has sudden onset severe right facial pain.  The pain is worse with movement improved with rest.  He has had no nausea or vomiting.  No double vision or blurred vision.  His tetanus is up-to-date.  He had no antecedent chest pain or palpitations.  Past Medical History:  Diagnosis Date  . AV block, 2nd degree   . Benign paroxysmal positional vertigo   . BPH (benign prostatic hyperplasia)   . CAD (coronary artery disease)   . Chronic bronchitis (HCC)   . Diabetes mellitus, type II (HCC)   . GERD (gastroesophageal reflux disease)   . HLD (hyperlipidemia)   . HTN (hypertension)   . OSA (obstructive sleep apnea)   . SOB (shortness of breath)   . Urinary retention   . Valvular heart disease     Patient Active Problem List   Diagnosis Date Noted  . Benign essential hypertension 12/25/2014  . Elevated PSA 12/22/2014  . BPH with obstruction/lower urinary tract symptoms 12/22/2014  . Health care maintenance 11/20/2014  . Parkinson's disease (HCC) 10/03/2014  . Postoperative CSF leak 10/03/2014  . 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 CAD (coronary artery disease) 10/27/2013  . COPD (chronic obstructive pulmonary disease) (HCC) 10/27/2013  . DM (diabetes mellitus) (HCC)  10/27/2013  . OSA (obstructive sleep apnea) 10/27/2013  . Tremor 10/27/2013  . SOB (shortness of breath) 09/17/2013  . GERD (gastroesophageal reflux disease) 09/13/2013  . Mixed hyperlipidemia 09/13/2013  . Second degree AV block 09/13/2013    Past Surgical History:  Procedure Laterality Date  . BRAIN SURGERY  2009  . CATARACT EXTRACTION Right 2014   Left 2015  . COLECTOMY  06/2011  . REPAIR DURAL / CSF LEAK  2009  . ROTATOR CUFF REPAIR Left    x 2  . TONSILLECTOMY      Prior to Admission medications   Medication Sig Start Date End Date Taking? Authorizing Provider  amoxicillin-clavulanate (AUGMENTIN) 875-125 MG tablet Take 1 tablet by mouth 2 (two) times daily for 10 days. 04/02/17 04/12/17  Merrily Brittleifenbark, Eve Rey, MD  aspirin EC 81 MG tablet Take by mouth.    [provider]  buPROPion [facility](WELLBUTRIN SR) 150 MG 12 hr tablet Reported on 06/22/2015 08/29/14   [provider]  Diphenhydramine-APAP, sleep, (TYLENOL PM EXTRA STRENGTH PO) Take by mouth.    [provider]  docusate sodium (COLACE) 100 MG capsule Take by mouth.    [provider]  feeding supplement, GLUCERNA SHAKE, (GLUCERNA SHAKE) LIQD Take 237 mLs by mouth 3 (three) times daily between meals.    [provider]  finasteride (PROSCAR) 5 MG tablet TAKE 1 TABLET BY MOUTH EVERY DAY 03/21/16   McGowan, Carollee HerterShannon A, PA-C  FOLIC ACID PO Take by mouth.    [provider]  furosemide (LASIX) 20 MG tablet  10/26/15   [provider]  lactulose (CHRONULAC) 10 GM/15ML solution Take by mouth 3 (three) times daily.    [provider]  lisinopril-hydrochlorothiazide (PRINZIDE,ZESTORETIC) 20-25 MG per tablet Take 1 tablet by mouth daily.    [provider]  Magnesium Oxide 500 MG TABS Take by mouth.    [provider]  Multiple Vitamins-Minerals (CENTRUM SILVER ADULT 50+ PO) Take by mouth.    [provider]  omeprazole (PRILOSEC) 20 MG capsule Take  20 mg by mouth daily.    [provider]  POLYETHYLENE GLYCOL 3350 PO Take by mouth.    [provider]  potassium chloride (K-DUR) 10 MEQ tablet TK 1 T PO D PRN WITH FLUID PILL 10/26/15   [provider]  rOPINIRole (REQUIP) 0.5 MG tablet Take by mouth. 11/01/16 04/30/17  [provider]  simvastatin (ZOCOR) 20 MG tablet Take 20 mg by mouth daily.    [provider]  tamsulosin (FLOMAX) 0.4 MG CAPS capsule TAKE ONE CAPSULE BY MOUTH DAILY Patient not taking: Reported on 01/30/2017 03/21/16   Michiel CowboyMcGowan, Shannon A, PA-C    Allergies Percocet [oxycodone-acetaminophen]  Family History  Problem Relation Age of Onset  . Stroke Father   . Epilepsy Father   . Heart attack Mother   . Blindness Mother   . Kidney disease Neg Hx   . Prostate cancer Neg Hx   . Kidney cancer Neg Hx   . Bladder Cancer Neg Hx     Social History Social History   Tobacco Use  . Smoking status: Former Games developermoker  . Smokeless tobacco: Never Used  . Tobacco comment: quit 7 years ago  Substance Use Topics  . Alcohol use: No    Alcohol/week: 0.0 oz    Frequency: Never  . Drug use: No    Review of Systems Constitutional: No fever/chills Eyes: No visual changes. ENT: No sore throat. Cardiovascular: Denies chest pain. Respiratory: Denies shortness of breath. Gastrointestinal: No abdominal pain.  No nausea, no vomiting.  No diarrhea.  No constipation. Genitourinary: Negative for dysuria. Musculoskeletal: Negative for back pain. Skin: Positive for wound Neurological: Positive for headache   ____________________________________________   PHYSICAL EXAM:  VITAL SIGNS: ED Triage Vitals  Enc Vitals Group     BP      Pulse      Resp      Temp      Temp src      SpO2      Weight      Height      Head Circumference      Peak Flow      Pain Score      Pain Loc      Pain Edu?      Excl. in GC?     Constitutional: Alert and oriented x4 pleasant cooperative no  distress Eyes: PERRL extraocular motions intact no entrapment. Right eye with medial subconjunctival hemmorhage Head: Significant ecchymosis and tenderness over right zygoma. Nose: No congestion/rhinnorhea. Mouth/Throat: No trismus Neck: No stridor.   Cardiovascular: Normal rate, regular rhythm. Grossly normal heart sounds.  Good peripheral circulation. Respiratory: Normal respiratory effort.  No retractions. Lungs CTAB and moving good air Gastrointestinal: Soft nontender Musculoskeletal: No tenderness over distal radius or distal ulna. No tenderness over snuffbox and no axial load discomfort Sensation intact to light touch over first dorsal webspace, distal index finger, distal small finger Can flex and oppose  thumb, cross 2 on 3,  and extend wrist 2+ radial pulse and less than 2 second capillary refill Skin is closed Small skin tear roughly 2 cm to the dorsal aspect of left hand over the third metacarpal   Neurologic:  Normal speech and language. No gross focal neurologic deficits are appreciated. Skin:  Skin is warm, dry and intact. No rash noted. Psychiatric: Mood and affect are normal. Speech and behavior are normal.    ____________________________________________   DIFFERENTIAL includes but not limited to  Intracerebral hemorrhage, cervical spine fracture, facial fracture, hand fracture, laceration, skin tear ____________________________________________   LABS (all labs ordered are listed, but only abnormal results are displayed)  Labs Reviewed - No data to display   __________________________________________  EKG   ____________________________________________  RADIOLOGY  CT scans reviewed by me shows multiple facial fractures but no intracerebral pathology ____________________________________________   PROCEDURES  Procedure(s) performed: no  Procedures  Critical Care performed: no  Observation:  no ____________________________________________   INITIAL IMPRESSION / ASSESSMENT AND PLAN / ED COURSE  Pertinent labs & imaging results that were available during my care of the patient were reviewed by me and considered in my medical decision making (see chart for details).  Patient arrives with significant right facial trauma after mechanical fall.  CT head neck and face performed given the degree of trauma and fortunately has no cervical spine pathology and no interest facial fractures although the patient is not entrapped.  X-ray of the hand is also negative.  Wounds cleaned out and dressed.  No lacerations requiring repair.  I will cover him with Augmentin given the sinus fracture and refer him to ENT as an outpatient.  The patient verbalizes understanding and agreement the plan.      ____________________________________________   FINAL CLINICAL IMPRESSION(S) / ED DIAGNOSES  Final diagnoses:  Open fracture of maxillary sinus, initial encounter (HCC)  Open fracture of right orbital floor, initial encounter (HCC)  Open fracture of nasal bone, initial encounter  Skin tear of left hand without complication, initial encounter      NEW MEDICATIONS STARTED DURING THIS VISIT:  This SmartLink is deprecated. Use AVSMEDLIST instead to display the medication list for a patient.   Note:  This document was prepared using Dragon voice recognition software and may include unintentional dictation errors.     Merrily Brittle, MD 04/02/17 1610    Merrily Brittle, MD 04/02/17 [phone]

## 2017-04-02 NOTE — ED Notes (Signed)
Esign not working, pt verbalized discharge instructions and has no questions at this time 

## 2017-04-02 NOTE — ED Notes (Signed)
Wife called and updated on pt's plan of care. Will call back when results are back from scans and update on next steps of care for pt. Reed PandyLinda Swader [phone]

## 2017-04-02 NOTE — Discharge Instructions (Signed)
Please take all of your antibiotics as prescribed and make an appointment to follow-up with the ear nose and throat specialist in 2 days for recheck.  Return to the emergency department sooner for any concerns.  It was a pleasure to take care of you today, and thank you for coming to our emergency department.  If you have any questions or concerns before leaving please ask the nurse to grab me and I'm more than happy to go through your aftercare instructions again.  If you were prescribed any opioid pain medication today such as Norco, Vicodin, Percocet, morphine, hydrocodone, or oxycodone please make sure you do not drive when you are taking this medication as it can alter your ability to drive safely.  If you have any concerns once you are home that you are not improving or are in fact getting worse before you can make it to your follow-up appointment, please do not hesitate to call 911 and come back for further evaluation.  Merrily BrittleNeil Mita Vallo, MD  Results for orders placed or performed in visit on 01/30/17  PSA  Result Value Ref Range   Prostate Specific Ag, Serum 1.7 0.0 - 4.0 ng/mL  Bladder Scan (Post Void Residual) in office  Result Value Ref Range   Scan Result 269    Ct Head Wo Contrast  Result Date: 04/02/2017 CLINICAL DATA:  Rolled out of bed, with erythema and swelling at the right cheek, and laceration at the nose. Concern for head or cervical spine injury. EXAM: CT HEAD WITHOUT CONTRAST CT MAXILLOFACIAL WITHOUT CONTRAST CT CERVICAL SPINE WITHOUT CONTRAST TECHNIQUE: Multidetector CT imaging of the head, cervical spine, and maxillofacial structures were performed using the standard protocol without intravenous contrast. Multiplanar CT image reconstructions of the cervical spine and maxillofacial structures were also generated. COMPARISON:  MRI of the brain performed 10/17/2016, and CT of the head performed 04/30/2014 FINDINGS: CT HEAD FINDINGS Brain: No evidence of acute infarction,  hemorrhage, hydrocephalus, extra-axial collection or mass lesion/mass effect. Prominence of the ventricles and sulci reflects mild cortical volume loss. Mild cerebellar atrophy is noted. Mild periventricular white matter change likely reflects small vessel ischemic microangiopathy. The brainstem and fourth ventricle are within normal limits. The basal ganglia are unremarkable in appearance. The cerebral hemispheres demonstrate grossly normal gray-white differentiation. No mass effect or midline shift is seen. Vascular: No hyperdense vessel or unexpected calcification. Skull: There is a comminuted fracture about the right maxillary sinus and through the right orbital floor, better characterized on maxillofacial images. A craniotomy flap is noted at the left parietal calvarium. The patient is status post left-sided mastoidectomy. Other: Soft tissue swelling is noted overlying the right maxilla, with scattered soft tissue air. Soft tissue swelling is noted overlying the left parietal calvarium. CT MAXILLOFACIAL FINDINGS Osseous: There is a comminuted fracture involving the anterior and lateral walls of the right maxillary sinus, and a depressed fracture involving the right orbital floor. Trace soft tissue hemorrhage is noted along the inferior right orbit. There is a minimally depressed fracture of the right side of the nasal bone, with overlying soft tissue swelling. The maxilla and mandible appear intact. The nasal bone is unremarkable in appearance. 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 Orbits: The left orbit is unremarkable in appearance. Sinuses: Blood is seen filling the right maxillary sinus. The remaining visualized paranasal sinuses and mastoid air cells are well-aerated. Soft tissues: Soft tissue swelling is noted overlying the right maxilla, with scattered soft tissue air, reflecting the fracture through the  right maxillary sinus. The parapharyngeal fat planes are preserved. The nasopharynx,  oropharynx and hypopharynx are unremarkable in appearance. The visualized portions of the valleculae and piriform sinuses are grossly unremarkable. The parotid and submandibular glands are within normal limits. No cervical lymphadenopathy is seen. CT CERVICAL SPINE FINDINGS Alignment: Normal. Skull base and vertebrae: No acute fracture. No primary bone lesion or focal pathologic process. Soft tissues and spinal canal: No prevertebral fluid or swelling. No visible canal hematoma. Disc levels: Multilevel disc space narrowing is noted along the cervical spine, with anterior and posterior disc osteophyte complexes. Underlying facet disease noted. Upper chest: Small to moderate bilateral pleural effusions are noted. Increased interstitial markings raise concern for interstitial edema. An accessory azygos lobe is noted. The thyroid gland is unremarkable in appearance. Other: No additional soft tissue abnormalities are seen. IMPRESSION: 1. No evidence of traumatic intracranial injury. 2. Comminuted fracture involving the anterior and lateral walls of the right maxillary sinus, and depressed fracture of the right orbital floor. Trace soft tissue hemorrhage along the inferior right orbit. 3. Minimally depressed fracture of the right side of the nasal bone. 4. No evidence of fracture or subluxation along the cervical spine. 5. Blood noted filling the right maxillary sinus. 6. Soft tissue swelling overlying the right maxilla, with scattered soft tissue air, reflecting the right maxillary fracture. 7. Soft tissue swelling overlying the left parietal calvarium. 8. Mild cortical volume loss and scattered small vessel ischemic microangiopathy. 9. Degenerative change along the cervical spine. 10. Small to moderate bilateral pleural effusions. Increased interstitial markings raise concern for pulmonary edema. Electronically Signed   By: Roanna Raider M.D.   On: 04/02/2017 05:34   Ct Cervical Spine Wo Contrast  Result Date:  04/02/2017 CLINICAL DATA:  Rolled out of bed, with erythema and swelling at the right cheek, and laceration at the nose. Concern for head or cervical spine injury. EXAM: CT HEAD WITHOUT CONTRAST CT MAXILLOFACIAL WITHOUT CONTRAST CT CERVICAL SPINE WITHOUT CONTRAST TECHNIQUE: Multidetector CT imaging of the head, cervical spine, and maxillofacial structures were performed using the standard protocol without intravenous contrast. Multiplanar CT image reconstructions of the cervical spine and maxillofacial structures were also generated. COMPARISON:  MRI of the brain performed 10/17/2016, and CT of the head performed 04/30/2014 FINDINGS: CT HEAD FINDINGS Brain: No evidence of acute infarction, hemorrhage, hydrocephalus, extra-axial collection or mass lesion/mass effect. Prominence of the ventricles and sulci reflects mild cortical volume loss. Mild cerebellar atrophy is noted. Mild periventricular white matter change likely reflects small vessel ischemic microangiopathy. The brainstem and fourth ventricle are within normal limits. The basal ganglia are unremarkable in appearance. The cerebral hemispheres demonstrate grossly normal gray-white differentiation. No mass effect or midline shift is seen. Vascular: No hyperdense vessel or unexpected calcification. Skull: There is a comminuted fracture about the right maxillary sinus and through the right orbital floor, better characterized on maxillofacial images. A craniotomy flap is noted at the left parietal calvarium. The patient is status post left-sided mastoidectomy. Other: Soft tissue swelling is noted overlying the right maxilla, with scattered soft tissue air. Soft tissue swelling is noted overlying the left parietal calvarium. CT MAXILLOFACIAL FINDINGS Osseous: There is a comminuted fracture involving the anterior and lateral walls of the right maxillary sinus, and a depressed fracture involving the right orbital floor. Trace soft tissue hemorrhage is noted along  the inferior right orbit. There is a minimally depressed fracture of the right side of the nasal bone, with overlying soft tissue swelling. The maxilla and  mandible appear intact. The nasal bone is unremarkable in appearance. 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 Orbits: The left orbit is unremarkable in appearance. Sinuses: Blood is seen filling the right maxillary sinus. The remaining visualized paranasal sinuses and mastoid air cells are well-aerated. Soft tissues: Soft tissue swelling is noted overlying the right maxilla, with scattered soft tissue air, reflecting the fracture through the right maxillary sinus. The parapharyngeal fat planes are preserved. The nasopharynx, oropharynx and hypopharynx are unremarkable in appearance. The visualized portions of the valleculae and piriform sinuses are grossly unremarkable. The parotid and submandibular glands are within normal limits. No cervical lymphadenopathy is seen. CT CERVICAL SPINE FINDINGS Alignment: Normal. Skull base and vertebrae: No acute fracture. No primary bone lesion or focal pathologic process. Soft tissues and spinal canal: No prevertebral fluid or swelling. No visible canal hematoma. Disc levels: Multilevel disc space narrowing is noted along the cervical spine, with anterior and posterior disc osteophyte complexes. Underlying facet disease noted. Upper chest: Small to moderate bilateral pleural effusions are noted. Increased interstitial markings raise concern for interstitial edema. An accessory azygos lobe is noted. The thyroid gland is unremarkable in appearance. Other: No additional soft tissue abnormalities are seen. IMPRESSION: 1. No evidence of traumatic intracranial injury. 2. Comminuted fracture involving the anterior and lateral walls of the right maxillary sinus, and depressed fracture of the right orbital floor. Trace soft tissue hemorrhage along the inferior right orbit. 3. Minimally depressed fracture of the right side  of the nasal bone. 4. No evidence of fracture or subluxation along the cervical spine. 5. Blood noted filling the right maxillary sinus. 6. Soft tissue swelling overlying the right maxilla, with scattered soft tissue air, reflecting the right maxillary fracture. 7. Soft tissue swelling overlying the left parietal calvarium. 8. Mild cortical volume loss and scattered small vessel ischemic microangiopathy. 9. Degenerative change along the cervical spine. 10. Small to moderate bilateral pleural effusions. Increased interstitial markings raise concern for pulmonary edema. Electronically Signed   By: Roanna RaiderJeffery  Chang M.D.   On: 04/02/2017 05:34   Dg Hand Complete Left  Result Date: 04/02/2017 CLINICAL DATA:  Status post fall out of bed, with left hand bruising. Initial encounter. EXAM: LEFT HAND - COMPLETE 3+ VIEW COMPARISON:  None. FINDINGS: A small osseous fragment adjacent to the proximal pisiform reflects injury of indeterminate age. The joint spaces are preserved. The carpal rows are intact, and demonstrate normal alignment. Mild soft tissue swelling is noted about the first 3 digits. IMPRESSION: Small osseous fragment adjacent to the proximal pisiform reflects injury of indeterminate age. No additional evidence for fracture. Electronically Signed   By: Roanna RaiderJeffery  Chang M.D.   On: 04/02/2017 04:57   Ct Maxillofacial Wo Cm  Result Date: 04/02/2017 CLINICAL DATA:  Rolled out of bed, with erythema and swelling at the right cheek, and laceration at the nose. Concern for head or cervical spine injury. EXAM: CT HEAD WITHOUT CONTRAST CT MAXILLOFACIAL WITHOUT CONTRAST CT CERVICAL SPINE WITHOUT CONTRAST TECHNIQUE: Multidetector CT imaging of the head, cervical spine, and maxillofacial structures were performed using the standard protocol without intravenous contrast. Multiplanar CT image reconstructions of the cervical spine and maxillofacial structures were also generated. COMPARISON:  MRI of the brain performed  10/17/2016, and CT of the head performed 04/30/2014 FINDINGS: CT HEAD FINDINGS Brain: No evidence of acute infarction, hemorrhage, hydrocephalus, extra-axial collection or mass lesion/mass effect. Prominence of the ventricles and sulci reflects mild cortical volume loss. Mild cerebellar atrophy is noted. Mild periventricular white  matter change likely reflects small vessel ischemic microangiopathy. The brainstem and fourth ventricle are within normal limits. The basal ganglia are unremarkable in appearance. The cerebral hemispheres demonstrate grossly normal gray-white differentiation. No mass effect or midline shift is seen. Vascular: No hyperdense vessel or unexpected calcification. Skull: There is a comminuted fracture about the right maxillary sinus and through the right orbital floor, better characterized on maxillofacial images. A craniotomy flap is noted at the left parietal calvarium. The patient is status post left-sided mastoidectomy. Other: Soft tissue swelling is noted overlying the right maxilla, with scattered soft tissue air. Soft tissue swelling is noted overlying the left parietal calvarium. CT MAXILLOFACIAL FINDINGS Osseous: There is a comminuted fracture involving the anterior and lateral walls of the right maxillary sinus, and a depressed fracture involving the right orbital floor. Trace soft tissue hemorrhage is noted along the inferior right orbit. There is a minimally depressed fracture of the right side of the nasal bone, with overlying soft tissue swelling. The maxilla and mandible appear intact. The nasal bone is unremarkable in appearance. 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 Orbits: The left orbit is unremarkable in appearance. Sinuses: Blood is seen filling the right maxillary sinus. The remaining visualized paranasal sinuses and mastoid air cells are well-aerated. Soft tissues: Soft tissue swelling is noted overlying the right maxilla, with scattered soft tissue air,  reflecting the fracture through the right maxillary sinus. The parapharyngeal fat planes are preserved. The nasopharynx, oropharynx and hypopharynx are unremarkable in appearance. The visualized portions of the valleculae and piriform sinuses are grossly unremarkable. The parotid and submandibular glands are within normal limits. No cervical lymphadenopathy is seen. CT CERVICAL SPINE FINDINGS Alignment: Normal. Skull base and vertebrae: No acute fracture. No primary bone lesion or focal pathologic process. Soft tissues and spinal canal: No prevertebral fluid or swelling. No visible canal hematoma. Disc levels: Multilevel disc space narrowing is noted along the cervical spine, with anterior and posterior disc osteophyte complexes. Underlying facet disease noted. Upper chest: Small to moderate bilateral pleural effusions are noted. Increased interstitial markings raise concern for interstitial edema. An accessory azygos lobe is noted. The thyroid gland is unremarkable in appearance. Other: No additional soft tissue abnormalities are seen. IMPRESSION: 1. No evidence of traumatic intracranial injury. 2. Comminuted fracture involving the anterior and lateral walls of the right maxillary sinus, and depressed fracture of the right orbital floor. Trace soft tissue hemorrhage along the inferior right orbit. 3. Minimally depressed fracture of the right side of the nasal bone. 4. No evidence of fracture or subluxation along the cervical spine. 5. Blood noted filling the right maxillary sinus. 6. Soft tissue swelling overlying the right maxilla, with scattered soft tissue air, reflecting the right maxillary fracture. 7. Soft tissue swelling overlying the left parietal calvarium. 8. Mild cortical volume loss and scattered small vessel ischemic microangiopathy. 9. Degenerative change along the cervical spine. 10. Small to moderate bilateral pleural effusions. Increased interstitial markings raise concern for pulmonary edema.  Electronically Signed   By: Roanna Raider M.D.   On: 04/02/2017 05:34

## 2017-04-02 NOTE — ED Triage Notes (Signed)
Patient brought in by ems from home. Patient rolled out of bed. Patient with redness and swelling to right cheek and laceration to nose. Patient unsure of LOC. Patient takes 81 mg Asprin daily. .Marland Kitchen

## 2017-04-02 NOTE — ED Notes (Signed)
Pt back from CT

## 2017-04-02 NOTE — ED Notes (Signed)
Patient transported to CT 

## 2017-04-05 ENCOUNTER — Ambulatory Visit: Payer: Medicare Other | Admitting: Urology

## 2017-04-11 ENCOUNTER — Ambulatory Visit: Payer: Medicare Other | Admitting: Urology

## 2017-04-18 ENCOUNTER — Ambulatory Visit: Payer: Medicare Other | Admitting: Urology

## 2017-04-25 ENCOUNTER — Ambulatory Visit: Payer: Medicare Other | Admitting: Urology

## 2017-05-02 ENCOUNTER — Ambulatory Visit: Payer: Medicare Other | Admitting: Urology

## 2017-05-09 ENCOUNTER — Ambulatory Visit: Payer: Medicare Other | Admitting: Urology

## 2017-05-16 ENCOUNTER — Ambulatory Visit: Payer: Medicare Other | Admitting: Urology

## 2017-05-23 ENCOUNTER — Ambulatory Visit: Payer: Medicare Other | Admitting: Urology

## 2017-05-30 ENCOUNTER — Ambulatory Visit: Payer: Medicare Other | Admitting: Urology

## 2018-09-06 ENCOUNTER — Other Ambulatory Visit: Payer: Self-pay

## 2018-09-06 ENCOUNTER — Emergency Department
Admission: EM | Admit: 2018-09-06 | Discharge: 2018-09-06 | Disposition: A | Discharge status: Home / Self Care | Payer: Medicare Other | Attending: Emergency Medicine | Admitting: Emergency Medicine

## 2018-09-06 ENCOUNTER — Encounter: Payer: Self-pay | Admitting: Medical Oncology

## 2018-09-06 ENCOUNTER — Emergency Department: Payer: Medicare Other

## 2018-09-06 DIAGNOSIS — J069 Acute upper respiratory infection, unspecified: Secondary | ICD-10-CM | POA: Diagnosis not present

## 2018-09-06 DIAGNOSIS — I10 Essential (primary) hypertension: Secondary | ICD-10-CM | POA: Insufficient documentation

## 2018-09-06 DIAGNOSIS — G20 Parkinson's disease: Secondary | ICD-10-CM | POA: Diagnosis not present

## 2018-09-06 DIAGNOSIS — R05 Cough: Secondary | ICD-10-CM | POA: Diagnosis present

## 2018-09-06 DIAGNOSIS — Z79899 Other long term (current) drug therapy: Secondary | ICD-10-CM | POA: Insufficient documentation

## 2018-09-06 DIAGNOSIS — J449 Chronic obstructive pulmonary disease, unspecified: Secondary | ICD-10-CM | POA: Insufficient documentation

## 2018-09-06 DIAGNOSIS — I2510 Atherosclerotic heart disease of native coronary artery without angina pectoris: Secondary | ICD-10-CM | POA: Insufficient documentation

## 2018-09-06 DIAGNOSIS — E119 Type 2 diabetes mellitus without complications: Secondary | ICD-10-CM | POA: Insufficient documentation

## 2018-09-06 DIAGNOSIS — Z87891 Personal history of nicotine dependence: Secondary | ICD-10-CM | POA: Insufficient documentation

## 2018-09-06 DIAGNOSIS — Z7982 Long term (current) use of aspirin: Secondary | ICD-10-CM | POA: Insufficient documentation

## 2018-09-06 LAB — CBC WITH DIFFERENTIAL/PLATELET
Abs Immature Granulocytes: 0.02 10*3/uL (ref 0.00–0.07)
Basophils Absolute: 0.1 10*3/uL (ref 0.0–0.1)
Basophils Relative: 1 %
Eosinophils Absolute: 0.4 10*3/uL (ref 0.0–0.5)
Eosinophils Relative: 5 %
HCT: 38.7 % — ABNORMAL LOW (ref 39.0–52.0)
Hemoglobin: 12.3 g/dL — ABNORMAL LOW (ref 13.0–17.0)
Immature Granulocytes: 0 %
Lymphocytes Relative: 27 %
Lymphs Abs: 2.2 10*3/uL (ref 0.7–4.0)
MCH: 27.7 pg (ref 26.0–34.0)
MCHC: 31.8 g/dL (ref 30.0–36.0)
MCV: 87.2 fL (ref 80.0–100.0)
Monocytes Absolute: 1.1 10*3/uL — ABNORMAL HIGH (ref 0.1–1.0)
Monocytes Relative: 14 %
Neutro Abs: 4.4 10*3/uL (ref 1.7–7.7)
Neutrophils Relative %: 53 %
Platelets: 236 10*3/uL (ref 150–400)
RBC: 4.44 MIL/uL (ref 4.22–5.81)
RDW: 17.6 % — ABNORMAL HIGH (ref 11.5–15.5)
WBC: 8.2 10*3/uL (ref 4.0–10.5)
nRBC: 0 % (ref 0.0–0.2)

## 2018-09-06 LAB — BASIC METABOLIC PANEL
Anion gap: 9 (ref 5–15)
BUN: 20 mg/dL (ref 8–23)
CO2: 25 mmol/L (ref 22–32)
Calcium: 8.7 mg/dL — ABNORMAL LOW (ref 8.9–10.3)
Chloride: 106 mmol/L (ref 98–111)
Creatinine, Ser: 1.1 mg/dL (ref 0.61–1.24)
GFR calc Af Amer: >60 mL/min (ref >60)
GFR calc non Af Amer: >60 mL/min (ref >60)
Glucose, Bld: 82 mg/dL (ref 70–99)
Potassium: 3.7 mmol/L (ref 3.5–5.1)
Sodium: 140 mmol/L (ref 135–145)

## 2018-09-06 LAB — TROPONIN I
Troponin I: 0.07 ng/mL (ref <0.03)
Troponin I: 0.06 ng/mL (ref <0.03)

## 2018-09-06 LAB — PROCALCITONIN: Procalcitonin: <0.1 ng/mL

## 2018-09-06 NOTE — ED Notes (Signed)
Spoke with pt's wife and gave update.

## 2018-09-06 NOTE — ED Triage Notes (Signed)
Pt to ED via ems with reports of congestion/cough x 2 weeks. pcp has been treating pt with po abx for possible pneumonia without relief. Pt has swelling in BLE.

## 2018-09-06 NOTE — ED Provider Notes (Signed)
[facility] Emergency Department Provider Note  ____________________________________________   I have reviewed the triage vital signs and the nursing notes.   HISTORY  Chief Complaint Cough   History limited by: Not Limited   HPI Gregory Griffin is a 78 y.o. male who presents to the emergency department today because of concern for continued cough and congestion. The patient states that he has been treated with multiple rounds of antibiotics without any relief. He has not had a chest x-ray performed. He denies any fevers. Says he does have history of some lung disease and lung history of smoking.   Records reviewed. Per medical record review patient has a history of atrial flutter  Past Medical History:  Diagnosis Date  . AV block, 2nd degree   . Benign paroxysmal positional vertigo   . BPH (benign prostatic hyperplasia)   . CAD (coronary artery disease)   . Chronic bronchitis (HCC)   . Diabetes mellitus, type II (HCC)   . GERD (gastroesophageal reflux disease)   . HLD (hyperlipidemia)   . HTN (hypertension)   . OSA (obstructive sleep apnea)   . SOB (shortness of breath)   . Urinary retention   . Valvular heart disease     Patient Active Problem List   Diagnosis Date Noted  . Benign essential hypertension 12/25/2014  . Elevated PSA 12/22/2014  . BPH with obstruction/lower urinary tract symptoms 12/22/2014  . Health care maintenance 11/20/2014  . Parkinson's disease (HCC) 10/03/2014  . Postoperative CSF leak 10/03/2014  . 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 CAD (coronary artery disease) 10/27/2013  . COPD (chronic obstructive pulmonary disease) (HCC) 10/27/2013  . DM (diabetes mellitus) (HCC) 10/27/2013  . OSA (obstructive sleep apnea) 10/27/2013  . Tremor 10/27/2013  . SOB (shortness of breath) 09/17/2013  . GERD  (gastroesophageal reflux disease) 09/13/2013  . Mixed hyperlipidemia 09/13/2013  . Second degree AV block 09/13/2013    Past Surgical History:  Procedure Laterality Date  . BRAIN SURGERY  2009  . CATARACT EXTRACTION Right 2014   Left 2015  . COLECTOMY  06/2011  . REPAIR DURAL / CSF LEAK  2009  . ROTATOR CUFF REPAIR Left    x 2  . TONSILLECTOMY      Prior to Admission medications   Medication Sig Start Date End Date Taking? Authorizing Provider  aspirin EC 81 MG tablet Take by mouth.    [provider]  buPROPion [facility] SR) 150 MG 12 hr tablet Reported on 06/22/2015 08/29/14   [provider]  Diphenhydramine-APAP, sleep, (TYLENOL PM EXTRA STRENGTH PO) Take by mouth.    [provider]  docusate sodium (COLACE) 100 MG capsule Take by mouth.    [provider]  feeding supplement, GLUCERNA SHAKE, (GLUCERNA SHAKE) LIQD Take 237 mLs by mouth 3 (three) times daily between meals.    [provider]  finasteride (PROSCAR) 5 MG tablet TAKE 1 TABLET BY MOUTH EVERY DAY 03/21/16   McGowan, Carollee Herter A, PA-C  FOLIC ACID PO Take by mouth.    [provider]  furosemide (LASIX) 20 MG tablet  10/26/15   [provider]  lactulose (CHRONULAC) 10 GM/15ML solution Take by mouth 3 (three) times daily.    [provider]  lisinopril-hydrochlorothiazide (PRINZIDE,ZESTORETIC) 20-25 MG per tablet Take 1 tablet by mouth daily.    [provider]  Magnesium Oxide 500 MG TABS Take by mouth.  [provider]  Multiple Vitamins-Minerals (CENTRUM SILVER ADULT 50+ PO) Take by mouth.    [provider]  omeprazole (PRILOSEC) 20 MG capsule Take 20 mg by mouth daily.    [provider]  POLYETHYLENE GLYCOL 3350 PO Take by mouth.    [provider]  potassium chloride (K-DUR) 10 MEQ tablet TK 1 T PO D PRN WITH FLUID PILL 10/26/15   [provider]  rOPINIRole (REQUIP) 0.5 MG tablet Take by  mouth. 11/01/16 04/30/17  [provider]  simvastatin (ZOCOR) 20 MG tablet Take 20 mg by mouth daily.    [provider]  tamsulosin (FLOMAX) 0.4 MG CAPS capsule TAKE ONE CAPSULE BY MOUTH DAILY Patient not taking: Reported on 01/30/2017 03/21/16   Michiel Cowboy A, PA-C    Allergies Percocet [oxycodone-acetaminophen]  Family History  Problem Relation Age of Onset  . Stroke Father   . Epilepsy Father   . Heart attack Mother   . Blindness Mother   . Kidney disease Neg Hx   . Prostate cancer Neg Hx   . Kidney cancer Neg Hx   . Bladder Cancer Neg Hx     Social History Social History   Tobacco Use  . Smoking status: Former Games developer  . Smokeless tobacco: Never Used  . Tobacco comment: quit 7 years ago  Substance Use Topics  . Alcohol use: No    Alcohol/week: 0.0 standard drinks    Frequency: Never  . Drug use: No    Review of Systems Constitutional: No fever/chills Eyes: No visual changes. ENT: No sore throat. Cardiovascular: Denies chest pain. Respiratory: Positive for cough.  Gastrointestinal: No abdominal pain.  No nausea, no vomiting.  No diarrhea.   Genitourinary: Negative for dysuria. Musculoskeletal: Negative for back pain. Skin: Negative for rash. Neurological: Negative for headaches, focal weakness or numbness.  ____________________________________________   PHYSICAL EXAM:  VITAL SIGNS: ED Triage Vitals  Enc Vitals Group     BP --      Pulse Rate 09/06/18 1520 (!) 56     Resp 09/06/18 1520 18     Temp 09/06/18 1520 98.1 F (36.7 C)     Temp Source 09/06/18 1520 Oral     SpO2 09/06/18 1520 99 %     Weight 09/06/18 1521 169 lb 12.1 oz (77 kg)     Height --      Head Circumference --      Peak Flow --      Pain Score 09/06/18 1520 0   Constitutional: Alert and oriented.  Eyes: Conjunctivae are normal.  ENT      Head: Normocephalic and atraumatic.      Nose: No congestion/rhinnorhea.      Mouth/Throat: Mucous membranes are  moist.      Neck: No stridor. Hematological/Lymphatic/Immunilogical: No cervical lymphadenopathy. Cardiovascular: Normal rate, regular rhythm.  No murmurs, rubs, or gallops. Respiratory: Normal respiratory effort without tachypnea nor retractions. Breath sounds are clear and equal bilaterally. No wheezes/rales/rhonchi. Gastrointestinal: Soft and non tender. No rebound. No guarding.  Genitourinary: Deferred Musculoskeletal: Normal range of motion in all extremities. No lower extremity edema. Neurologic:  Normal speech and language. No gross focal neurologic deficits are appreciated.  Skin:  Skin is warm, dry and intact. No rash noted. Psychiatric: Mood and affect are normal. Speech and behavior are normal. Patient exhibits appropriate insight and judgment.  ____________________________________________    LABS (pertinent positives/negatives)  Trop 0.07->0.06 BMP wnl except ca 8.7 CBC wbc 8.2, hgb 12.3, plt  236 Procalcitonin <0.10  ____________________________________________   EKG  I, Phineas SemenGraydon Arthuro Canelo, attending physician, personally viewed and interpreted this EKG  EKG Time: 1519 Rate: 56 Rhythm: atrial flutter Axis: left axis deviation Intervals: qtc 447 QRS: RBBB, LAFB ST changes: no st elevation Impression: abnormal ekg   ____________________________________________    RADIOLOGY  CXR Mild left lower lobe opacity. Atelectasis vs pneumonia ____________________________________________   PROCEDURES  Procedures  ____________________________________________   INITIAL IMPRESSION / ASSESSMENT AND PLAN / ED COURSE  Pertinent labs & imaging results that were available during my care of the patient were reviewed by me and considered in my medical decision making (see chart for details).   Patient presents to the emergency department today because of 3 weeks of cough and congestion that not responded to antibiotics.  Patient is afebrile here and denies any fevers.   Chest x-ray showed possible pneumonia versus atelectasis.  Procalcitonin and serum white blood cell count both negative.  At this point I doubt a pneumonia.  Think more likely atelectasis.  Think patient also likely suffering from viral URI versus bronchitis.  I discussed this with the patient.  Patient without any respiratory distress here.  ____________________________________________   FINAL CLINICAL IMPRESSION(S) / ED DIAGNOSES  Final diagnoses:  Upper respiratory tract infection, unspecified type     Note: This dictation was prepared with Dragon dictation. Any transcriptional errors that result from this process are unintentional     Phineas SemenGoodman, Sharyl Panchal, MD 09/06/18 2045

## 2018-09-06 NOTE — Discharge Instructions (Addendum)
Please seek medical attention for any high fevers, chest pain, shortness of breath, change in behavior, persistent vomiting, bloody stool or any other new or concerning symptoms.  

## 2018-09-06 NOTE — ED Notes (Signed)
Date and time results received: 09/06/18 4:57 PM  (use smartphrase ".now" to insert current time)  Test: Trop Critical Value: .07  Name of Provider Notified: Derrill Kay  Orders Received? Or Actions Taken?: Critical results acknowledged

## 2018-12-12 ENCOUNTER — Observation Stay
Admission: EM | Admit: 2018-12-12 | Discharge: 2018-12-13 | Disposition: A | Discharge status: Home / Self Care | Payer: No Typology Code available for payment source | Attending: Internal Medicine | Admitting: Internal Medicine

## 2018-12-12 ENCOUNTER — Other Ambulatory Visit: Payer: Self-pay

## 2018-12-12 ENCOUNTER — Emergency Department: Payer: No Typology Code available for payment source

## 2018-12-12 DIAGNOSIS — J449 Chronic obstructive pulmonary disease, unspecified: Secondary | ICD-10-CM | POA: Diagnosis not present

## 2018-12-12 DIAGNOSIS — Z7982 Long term (current) use of aspirin: Secondary | ICD-10-CM | POA: Insufficient documentation

## 2018-12-12 DIAGNOSIS — G20 Parkinson's disease: Secondary | ICD-10-CM | POA: Diagnosis not present

## 2018-12-12 DIAGNOSIS — I4891 Unspecified atrial fibrillation: Secondary | ICD-10-CM | POA: Diagnosis not present

## 2018-12-12 DIAGNOSIS — Z20828 Contact with and (suspected) exposure to other viral communicable diseases: Secondary | ICD-10-CM | POA: Diagnosis not present

## 2018-12-12 DIAGNOSIS — R7989 Other specified abnormal findings of blood chemistry: Secondary | ICD-10-CM | POA: Insufficient documentation

## 2018-12-12 DIAGNOSIS — E782 Mixed hyperlipidemia: Secondary | ICD-10-CM | POA: Diagnosis not present

## 2018-12-12 DIAGNOSIS — E119 Type 2 diabetes mellitus without complications: Secondary | ICD-10-CM | POA: Diagnosis not present

## 2018-12-12 DIAGNOSIS — N400 Enlarged prostate without lower urinary tract symptoms: Secondary | ICD-10-CM | POA: Diagnosis not present

## 2018-12-12 DIAGNOSIS — I4892 Unspecified atrial flutter: Secondary | ICD-10-CM | POA: Insufficient documentation

## 2018-12-12 DIAGNOSIS — Z66 Do not resuscitate: Secondary | ICD-10-CM | POA: Insufficient documentation

## 2018-12-12 DIAGNOSIS — R079 Chest pain, unspecified: Principal | ICD-10-CM | POA: Diagnosis present

## 2018-12-12 DIAGNOSIS — I110 Hypertensive heart disease with heart failure: Secondary | ICD-10-CM | POA: Insufficient documentation

## 2018-12-12 DIAGNOSIS — Z79899 Other long term (current) drug therapy: Secondary | ICD-10-CM | POA: Diagnosis not present

## 2018-12-12 DIAGNOSIS — F329 Major depressive disorder, single episode, unspecified: Secondary | ICD-10-CM | POA: Insufficient documentation

## 2018-12-12 DIAGNOSIS — G4733 Obstructive sleep apnea (adult) (pediatric): Secondary | ICD-10-CM | POA: Insufficient documentation

## 2018-12-12 DIAGNOSIS — I2510 Atherosclerotic heart disease of native coronary artery without angina pectoris: Secondary | ICD-10-CM | POA: Insufficient documentation

## 2018-12-12 DIAGNOSIS — Z23 Encounter for immunization: Secondary | ICD-10-CM | POA: Insufficient documentation

## 2018-12-12 DIAGNOSIS — I5021 Acute systolic (congestive) heart failure: Secondary | ICD-10-CM | POA: Insufficient documentation

## 2018-12-12 DIAGNOSIS — Z87891 Personal history of nicotine dependence: Secondary | ICD-10-CM | POA: Insufficient documentation

## 2018-12-12 DIAGNOSIS — K219 Gastro-esophageal reflux disease without esophagitis: Secondary | ICD-10-CM | POA: Insufficient documentation

## 2018-12-12 DIAGNOSIS — R778 Other specified abnormalities of plasma proteins: Secondary | ICD-10-CM

## 2018-12-12 LAB — BASIC METABOLIC PANEL
Anion gap: 11 (ref 5–15)
BUN: 19 mg/dL (ref 8–23)
CO2: 25 mmol/L (ref 22–32)
Calcium: 9.1 mg/dL (ref 8.9–10.3)
Chloride: 106 mmol/L (ref 98–111)
Creatinine, Ser: 0.93 mg/dL (ref 0.61–1.24)
GFR calc Af Amer: >60 mL/min (ref >60)
GFR calc non Af Amer: >60 mL/min (ref >60)
Glucose, Bld: 108 mg/dL — ABNORMAL HIGH (ref 70–99)
Potassium: 3.8 mmol/L (ref 3.5–5.1)
Sodium: 142 mmol/L (ref 135–145)

## 2018-12-12 LAB — GLUCOSE, CAPILLARY
Glucose-Capillary: 132 mg/dL — ABNORMAL HIGH (ref 70–99)
Glucose-Capillary: 119 mg/dL — ABNORMAL HIGH (ref 70–99)

## 2018-12-12 LAB — CBC
HCT: 46.1 % (ref 39.0–52.0)
Hemoglobin: 14.6 g/dL (ref 13.0–17.0)
MCH: 27.3 pg (ref 26.0–34.0)
MCHC: 31.7 g/dL (ref 30.0–36.0)
MCV: 86.2 fL (ref 80.0–100.0)
Platelets: 193 10*3/uL (ref 150–400)
RBC: 5.35 MIL/uL (ref 4.22–5.81)
RDW: 18 % — ABNORMAL HIGH (ref 11.5–15.5)
WBC: 7.9 10*3/uL (ref 4.0–10.5)
nRBC: 0 % (ref 0.0–0.2)

## 2018-12-12 LAB — APTT: aPTT: 34 seconds (ref 24–36)

## 2018-12-12 LAB — TROPONIN I (HIGH SENSITIVITY)
Troponin I (High Sensitivity): 84 ng/L — ABNORMAL HIGH (ref <18)
Troponin I (High Sensitivity): 92 ng/L — ABNORMAL HIGH (ref <18)
Troponin I (High Sensitivity): 97 ng/L — ABNORMAL HIGH (ref <18)

## 2018-12-12 LAB — PROTIME-INR
INR: 1.1 (ref 0.8–1.2)
Prothrombin Time: 14.4 seconds (ref 11.4–15.2)

## 2018-12-12 MED ORDER — POLYETHYLENE GLYCOL 3350 17 G PO PACK
17.0000 g | PACK | Freq: Every day | ORAL | Status: DC
  Administered 2018-12-12 – 2018-12-13 (×2): 17 g via ORAL
  Filled 2018-12-12 (×2): qty 1

## 2018-12-12 MED ORDER — INSULIN ASPART 100 UNIT/ML ~~LOC~~ SOLN
0.0000 [IU] | Freq: Three times a day (TID) | SUBCUTANEOUS | Status: DC
  Administered 2018-12-12 – 2018-12-13 (×2): 1 [IU] via SUBCUTANEOUS
  Filled 2018-12-12 (×2): qty 1

## 2018-12-12 MED ORDER — LACTULOSE 10 GM/15ML PO SOLN: 10.0000 g | Freq: Every day | ORAL | Status: DC | PRN

## 2018-12-12 MED ORDER — TAMSULOSIN HCL 0.4 MG PO CAPS
0.4000 mg | ORAL_CAPSULE | Freq: Every day | ORAL | Status: DC
  Administered 2018-12-12 – 2018-12-13 (×2): 0.4 mg via ORAL
  Filled 2018-12-12 (×2): qty 1

## 2018-12-12 MED ORDER — INSULIN ASPART 100 UNIT/ML ~~LOC~~ SOLN: 0.0000 [IU] | Freq: Every day | SUBCUTANEOUS | Status: DC

## 2018-12-12 MED ORDER — PANTOPRAZOLE SODIUM 40 MG PO TBEC
40.0000 mg | DELAYED_RELEASE_TABLET | Freq: Every day | ORAL | Status: DC
  Administered 2018-12-13: 40 mg via ORAL
  Filled 2018-12-12: qty 1

## 2018-12-12 MED ORDER — HEPARIN BOLUS VIA INFUSION
4000.0000 [IU] | Freq: Once | INTRAVENOUS | Status: AC
  Administered 2018-12-12: 14:00:00 4000 [IU] via INTRAVENOUS
  Filled 2018-12-12: qty 4000

## 2018-12-12 MED ORDER — HEPARIN (PORCINE) 25000 UT/250ML-% IV SOLN
1400.0000 [IU]/h | INTRAVENOUS | Status: DC
  Administered 2018-12-12: 1250 [IU]/h via INTRAVENOUS
  Administered 2018-12-13: 1400 [IU]/h via INTRAVENOUS
  Filled 2018-12-12 (×4): qty 250

## 2018-12-12 MED ORDER — PNEUMOCOCCAL VAC POLYVALENT 25 MCG/0.5ML IJ INJ
0.5000 mL | INJECTION | INTRAMUSCULAR | Status: AC
  Administered 2018-12-13: 10:00:00 0.5 mL via INTRAMUSCULAR
  Filled 2018-12-12: qty 0.5

## 2018-12-12 MED ORDER — ACETAMINOPHEN 325 MG PO TABS: 650.0000 mg | ORAL_TABLET | ORAL | Status: DC | PRN

## 2018-12-12 MED ORDER — ONDANSETRON HCL 4 MG/2ML IJ SOLN: 4.0000 mg | Freq: Four times a day (QID) | INTRAMUSCULAR | Status: DC | PRN

## 2018-12-12 MED ORDER — SODIUM CHLORIDE 0.9% FLUSH
10.0000 mL | Freq: Two times a day (BID) | INTRAVENOUS | Status: DC
  Administered 2018-12-13: 10 mL via INTRAVENOUS

## 2018-12-12 MED ORDER — CARBIDOPA-LEVODOPA 25-100 MG PO TBDP: 0.5000 | ORAL_TABLET | Freq: Three times a day (TID) | ORAL | Status: DC

## 2018-12-12 MED ORDER — BUPROPION HCL ER (SR) 150 MG PO TB12
150.0000 mg | ORAL_TABLET | Freq: Two times a day (BID) | ORAL | Status: DC
  Administered 2018-12-12 – 2018-12-13 (×2): 150 mg via ORAL
  Filled 2018-12-12 (×3): qty 1

## 2018-12-12 MED ORDER — ASPIRIN EC 81 MG PO TBEC
81.0000 mg | DELAYED_RELEASE_TABLET | Freq: Every day | ORAL | Status: DC
  Administered 2018-12-12: 81 mg via ORAL
  Filled 2018-12-12: qty 1

## 2018-12-12 MED ORDER — TRAMADOL HCL 50 MG PO TABS
50.0000 mg | ORAL_TABLET | Freq: Four times a day (QID) | ORAL | Status: DC | PRN
  Administered 2018-12-12: 50 mg via ORAL
  Filled 2018-12-12: qty 1

## 2018-12-12 MED ORDER — ALPRAZOLAM 0.25 MG PO TABS: 0.2500 mg | ORAL_TABLET | Freq: Two times a day (BID) | ORAL | Status: DC | PRN

## 2018-12-12 MED ORDER — LORATADINE 10 MG PO TABS
10.0000 mg | ORAL_TABLET | Freq: Every day | ORAL | Status: DC
  Administered 2018-12-13: 10 mg via ORAL
  Filled 2018-12-12: qty 1

## 2018-12-12 MED ORDER — SENNOSIDES-DOCUSATE SODIUM 8.6-50 MG PO TABS
2.0000 | ORAL_TABLET | Freq: Two times a day (BID) | ORAL | Status: DC
  Administered 2018-12-12 – 2018-12-13 (×2): 2 via ORAL
  Filled 2018-12-12 (×3): qty 2

## 2018-12-12 MED ORDER — LISINOPRIL 20 MG PO TABS
40.0000 mg | ORAL_TABLET | Freq: Every day | ORAL | Status: DC
  Administered 2018-12-12 – 2018-12-13 (×2): 40 mg via ORAL
  Filled 2018-12-12 (×2): qty 2

## 2018-12-12 MED ORDER — ROPINIROLE HCL 1 MG PO TABS
1.0000 mg | ORAL_TABLET | Freq: Every day | ORAL | Status: DC
  Administered 2018-12-12: 1 mg via ORAL
  Filled 2018-12-12: qty 1

## 2018-12-12 MED ORDER — ZOLPIDEM TARTRATE 5 MG PO TABS: 5.0000 mg | ORAL_TABLET | Freq: Every evening | ORAL | Status: DC | PRN

## 2018-12-12 MED ORDER — CARBIDOPA-LEVODOPA ER 25-100 MG PO TBCR
0.5000 | EXTENDED_RELEASE_TABLET | Freq: Three times a day (TID) | ORAL | Status: DC
  Administered 2018-12-12 – 2018-12-13 (×4): 0.5 via ORAL
  Filled 2018-12-12 (×5): qty 0.5

## 2018-12-12 MED ORDER — GLUCERNA SHAKE PO LIQD
237.0000 mL | Freq: Three times a day (TID) | ORAL | Status: DC
  Administered 2018-12-12 – 2018-12-13 (×4): 237 mL via ORAL

## 2018-12-12 MED ORDER — ISOSORBIDE MONONITRATE ER 30 MG PO TB24
30.0000 mg | ORAL_TABLET | Freq: Every day | ORAL | Status: DC
  Administered 2018-12-12 – 2018-12-13 (×2): 30 mg via ORAL
  Filled 2018-12-12 (×2): qty 1

## 2018-12-12 MED ORDER — FUROSEMIDE 20 MG PO TABS
20.0000 mg | ORAL_TABLET | Freq: Every day | ORAL | Status: DC
  Administered 2018-12-12: 20 mg via ORAL
  Filled 2018-12-12: qty 1

## 2018-12-12 MED ORDER — FINASTERIDE 5 MG PO TABS
5.0000 mg | ORAL_TABLET | Freq: Every day | ORAL | Status: DC
  Administered 2018-12-12 – 2018-12-13 (×2): 5 mg via ORAL
  Filled 2018-12-12 (×2): qty 1

## 2018-12-12 MED ORDER — SIMVASTATIN 20 MG PO TABS
20.0000 mg | ORAL_TABLET | Freq: Every day | ORAL | Status: DC
  Administered 2018-12-12 – 2018-12-13 (×2): 20 mg via ORAL
  Filled 2018-12-12 (×2): qty 1

## 2018-12-12 MED ORDER — HYDRALAZINE HCL 20 MG/ML IJ SOLN
10.0000 mg | Freq: Four times a day (QID) | INTRAMUSCULAR | Status: DC | PRN
  Administered 2018-12-12: 10 mg via INTRAVENOUS
  Filled 2018-12-12: qty 1

## 2018-12-12 NOTE — H&P (Addendum)
[facility] [place] [facility]   PATIENT NAME: Gregory Spearmanhurman Griffin    MR#:  [id number]  DATE OF BIRTH:  05/18/1940  DATE OF ADMISSION:  12/12/2018  [facility]   REQUESTING/REFERRING PHYSICIAN: Dr. Mayford KnifeWilliams.  CHIEF COMPLAINT:   Chief Complaint  Patient presents with  . Chest Pain   Chest pain for 1 week. HISTORY OF PRESENT ILLNESS:  Gregory Spearmanhurman Poch  is a 78 y.o. male with a known history of hypertension, diabetes, CAD, chronic bronchitis, OSA, BPH etc.  The patient has had a chest pain for 1 week which is in substernal area, intermittent, radiation to shoulder and back.  He also complains of congestion but denies any fever or chills, cough, orthopnea or nocturnal dyspnea.  He is found to have A. fib which is new.  His troponin is mildly elevated.  Dr. Mayford KnifeWilliams requested admission. PAST MEDICAL HISTORY:   Past Medical History:  Diagnosis Date  . AV block, 2nd degree   . Benign paroxysmal positional vertigo   . BPH (benign prostatic hyperplasia)   . CAD (coronary artery disease)   . Chronic bronchitis (HCC)   . Diabetes mellitus, type II (HCC)   . GERD (gastroesophageal reflux disease)   . HLD (hyperlipidemia)   . HTN (hypertension)   . OSA (obstructive sleep apnea)   . SOB (shortness of breath)   . Urinary retention   . Valvular heart disease     PAST SURGICAL HISTORY:   Past Surgical History:  Procedure Laterality Date  . BRAIN SURGERY  2009  . CATARACT EXTRACTION Right 2014   Left 2015  . COLECTOMY  06/2011  . REPAIR DURAL / CSF LEAK  2009  . ROTATOR CUFF REPAIR Left    x 2  . TONSILLECTOMY      SOCIAL HISTORY:   Social History   Tobacco Use  . Smoking status: Former Games developermoker  . Smokeless tobacco: Never Used  . Tobacco comment: quit 7 years ago  Substance Use Topics  . Alcohol use: No    Alcohol/week: 0.0 standard drinks    Frequency: Never    FAMILY HISTORY:   Family History  Problem  Relation Age of Onset  . Stroke Father   . Epilepsy Father   . Heart attack Mother   . Blindness Mother   . Kidney disease Neg Hx   . Prostate cancer Neg Hx   . Kidney cancer Neg Hx   . Bladder Cancer Neg Hx     DRUG ALLERGIES:   Allergies  Allergen Reactions  . Percocet [Oxycodone-Acetaminophen] Nausea And Vomiting    REVIEW OF SYSTEMS:   Review of Systems  Constitutional: Negative for chills, fever and malaise/fatigue.  HENT: Negative for sore throat.   Eyes: Negative for blurred vision and double vision.  Respiratory: Negative for cough, hemoptysis, shortness of breath, wheezing and stridor.        Congestion.  Cardiovascular: Positive for chest pain. Negative for palpitations, orthopnea and leg swelling.  Gastrointestinal: Negative for abdominal pain, blood in stool, diarrhea, melena, nausea and vomiting.  Genitourinary: Negative for dysuria, flank pain and hematuria.  Musculoskeletal: Negative for back pain and joint pain.  Skin: Negative for rash.  Neurological: Negative for dizziness, sensory change, focal weakness, seizures, loss of consciousness, weakness and headaches.  Endo/Heme/Allergies: Negative for polydipsia.  Psychiatric/Behavioral: Negative for depression. The patient is not nervous/anxious.     MEDICATIONS AT HOME:   Prior to Admission medications   Medication  Sig Start Date End Date Taking? Authorizing Provider  aspirin EC 81 MG tablet Take 81 mg by mouth at bedtime.    Yes [provider]  buPROPion (WELLBUTRIN SR) 150 MG 12 hr tablet Take 150 mg by mouth 2 (two) times daily.  08/29/14  Yes [provider]  carbidopa-levodopa (PARCOPA) 25-100 MG disintegrating tablet Take 0.5 tablets by mouth 3 (three) times daily.   Yes [provider]  feeding supplement, GLUCERNA SHAKE, (GLUCERNA SHAKE) LIQD Take 237 mLs by mouth 3 (three) times daily between meals.   Yes [provider]  finasteride (PROSCAR) 5 MG tablet TAKE 1  TABLET BY MOUTH EVERY DAY 03/21/16  Yes McGowan, Larene Beach A, PA-C  furosemide (LASIX) 20 MG tablet Take 20 mg by mouth daily.  10/26/15  Yes [provider]  lactulose (CHRONULAC) 10 GM/15ML solution Take 10 g by mouth daily as needed.    Yes [provider]  lisinopril (ZESTRIL) 40 MG tablet Take 40 mg by mouth daily.   Yes [provider]  loratadine (CLARITIN) 10 MG tablet Take 10 mg by mouth daily.   Yes [provider]  Multiple Vitamins-Minerals (CENTRUM SILVER ADULT 50+ PO) Take by mouth.   Yes [provider]  Multiple Vitamins-Minerals (PRESERVISION AREDS 2) CAPS Take 1 capsule by mouth 2 (two) times a day.   Yes [provider]  omeprazole (PRILOSEC) 20 MG capsule Take 20 mg by mouth daily.   Yes [provider]  polyethylene glycol (MIRALAX / GLYCOLAX) 17 g packet Take 17 g by mouth daily.    Yes [provider]  potassium chloride (K-DUR) 10 MEQ tablet TK 1 T PO D PRN WITH FLUID PILL 10/26/15  Yes [provider]  rOPINIRole (REQUIP) 1 MG tablet Take 1 mg by mouth at bedtime.  11/01/16 12/12/18 Yes [provider]  sennosides-docusate sodium (SENOKOT-S) 8.6-50 MG tablet Take 2 tablets by mouth 2 (two) times daily.   Yes [provider]  simvastatin (ZOCOR) 20 MG tablet Take 20 mg by mouth daily.   Yes [provider]  tamsulosin (FLOMAX) 0.4 MG CAPS capsule TAKE ONE CAPSULE BY MOUTH DAILY 03/21/16  Yes McGowan, Larene Beach A, PA-C      VITAL SIGNS:  Blood pressure (!) 178/67, pulse 61, temperature 97.8 F (36.6 C), temperature source Oral, resp. rate 18, height 6' (1.829 m), weight 81.6 kg, SpO2 93 %.  PHYSICAL EXAMINATION:  Physical Exam  GENERAL:  78 y.o.-year-old patient lying in the bed with no acute distress.  EYES: Pupils equal, round, reactive to light and accommodation. No scleral icterus. Extraocular muscles intact.  HEENT: Head atraumatic, normocephalic. NECK:  Supple, no  jugular venous distention. No thyroid enlargement, no tenderness.  LUNGS: Normal breath sounds bilaterally, no wheezing, rales,rhonchi or crepitation. No use of accessory muscles of respiration.  CARDIOVASCULAR: S1, S2 normal. No murmurs, rubs, or gallops.  ABDOMEN: Soft, nontender, nondistended. Bowel sounds present. No organomegaly or mass.  EXTREMITIES: No pedal edema, cyanosis, or clubbing.  NEUROLOGIC: Cranial nerves II through XII are intact. Muscle strength 5/5 in all extremities. Sensation intact. Gait not checked.  PSYCHIATRIC: The patient is alert and oriented x 3.  SKIN: No obvious rash, lesion, or ulcer.   LABORATORY PANEL:   CBC Recent Labs  Lab 12/12/18 0930  WBC 7.9  HGB 14.6  HCT 46.1  PLT 193   ------------------------------------------------------------------------------------------------------------------  Chemistries  Recent Labs  Lab 12/12/18 0930  NA 142  K 3.8  CL 106  CO2 25  GLUCOSE 108*  BUN 19  CREATININE 0.93  CALCIUM 9.1   ------------------------------------------------------------------------------------------------------------------  Cardiac Enzymes No results for input(s): TROPONINI in the last 168 hours. ------------------------------------------------------------------------------------------------------------------  RADIOLOGY:  Dg Chest Port 1 View  Result Date: 12/12/2018 CLINICAL DATA:  Chest pain EXAM: PORTABLE CHEST 1 VIEW COMPARISON:  09/06/2018 FINDINGS: Cardiac shadow is enlarged but stable. Aortic calcifications are again seen. Increased vascular congestion is noted with mild interstitial edema. Previously seen left basilar pneumonia has cleared. IMPRESSION: Persistent vascular congestion with interstitial edema. No focal infiltrate is noted. Electronically Signed   By: Alcide Clever M.D.   On: 12/12/2018 09:48      IMPRESSION AND PLAN:   Chest pain with elevated troponin and new onset A. Fib. The patient will be placed for  observation. Start heparin drip, continue aspirin and statin, follow-up with troponin level and cardiology consult.  Hypertension.  Continue home hypertension medication. Diabetes.  Start sliding scale.  All the records are reviewed and case discussed with ED provider. Management plans discussed with the patient, family and they are in agreement.  CODE STATUS: DNR.  TOTAL TIME TAKING CARE OF THIS PATIENT: 42 minutes.    Shaune Pollack M.D on 12/12/2018 at 1:48 PM  Between 7am to 6pm - Pager - [phone]  After 6pm go to [email or web address] - Social research officer, government  [facility]  Office  [phone]  CC: Primary care physician; [facility]   Note: This dictation was prepared with Dragon dictation along with smaller phrase technology. Any transcriptional errors that result from this process are unin

## 2018-12-12 NOTE — ED Notes (Signed)
ED TO INPATIENT HANDOFF REPORT  ED Nurse Name and Phone #: valerie [phone]  S Name/Age/Gender Gregory Griffin 78 y.o. male Room/Bed: ED36A/ED36A  Code Status   Code Status: Not on file  Home/SNF/Other Home Patient oriented to: self, place, time and situation Is this baseline? Yes   Triage Complete: Triage complete  Chief Complaint cp  Triage Note Pt had central chest pain that woke him out of of sleep this AM with nausea.  No pain at this moment. Unlabored. VSS.  No fevers.  No cough.     Allergies Allergies  Allergen Reactions  . Percocet [Oxycodone-Acetaminophen] Nausea And Vomiting    Level of Care/Admitting Diagnosis ED Disposition    ED Disposition Condition [facility] Area: [place] [100120]  Level of Care: Telemetry [5]  Covid Evaluation: Person Under Investigation (PUI)  Diagnosis: Chest pain [130865]  Admitting Physician: Demetrios Loll [[id number]  Attending Physician: Demetrios Loll [phone]  PT Class (Do Not Modify): Observation [104]  PT Acc Code (Do Not Modify): Observation [10022]       B Medical/Surgery History Past Medical History:  Diagnosis Date  . AV block, 2nd degree   . Benign paroxysmal positional vertigo   . BPH (benign prostatic hyperplasia)   . CAD (coronary artery disease)   . Chronic bronchitis ([place])   . Diabetes mellitus, type II ([place])   . GERD (gastroesophageal reflux disease)   . HLD (hyperlipidemia)   . HTN (hypertension)   . OSA (obstructive sleep apnea)   . SOB (shortness of breath)   . Urinary retention   . Valvular heart disease    Past Surgical History:  Procedure Laterality Date  . BRAIN SURGERY  2009  . CATARACT EXTRACTION Right 2014   Left 2015  . COLECTOMY  06/2011  . REPAIR DURAL / CSF LEAK  2009  . ROTATOR CUFF REPAIR Left    x 2  . TONSILLECTOMY       A IV Location/Drains/Wounds Patient Lines/Drains/Airways Status   Active Line/Drains/Airways    Name:   Placement  date:   Placement time:   Site:   Days:   Peripheral IV 12/12/18 Posterior;Right Forearm   12/12/18    -    Forearm   less than 1   Peripheral IV 12/12/18 Left Forearm   12/12/18    1415    Forearm   less than 1          Intake/Output Last 24 hours No intake or output data in the 24 hours ending 12/12/18 1516  Labs/Imaging Results for orders placed or performed during the hospital encounter of 12/12/18 (from the past 48 hour(s))  Basic metabolic panel     Status: Abnormal   Collection Time: 12/12/18  9:30 AM  Result Value Ref Range   Sodium 142 135 - 145 mmol/L   Potassium 3.8 3.5 - 5.1 mmol/L   Chloride 106 98 - 111 mmol/L   CO2 25 22 - 32 mmol/L   Glucose, Bld 108 (H) 70 - 99 mg/dL   BUN 19 8 - 23 mg/dL   Creatinine, Ser 0.93 0.61 - 1.24 mg/dL   Calcium 9.1 8.9 - 10.3 mg/dL   GFR calc non Af Amer >60 >60 mL/min   GFR calc Af Amer >60 >60 mL/min   Anion gap 11 5 - 15    Comment: Performed at [facility], [address], [place], [address] [address]  CBC     Status:  Abnormal   Collection Time: 12/12/18  9:30 AM  Result Value Ref Range   WBC 7.9 4.0 - 10.5 K/uL   RBC 5.35 4.22 - 5.81 MIL/uL   Hemoglobin 14.6 13.0 - 17.0 g/dL   HCT 13.046.1 86.539.0 - 78.452.0 %   MCV 86.2 80.0 - 100.0 fL   MCH 27.3 26.0 - 34.0 pg   MCHC 31.7 30.0 - 36.0 g/dL   RDW 69.618.0 (H) 29.511.5 - 28.415.5 %   Platelets 193 150 - 400 K/uL   nRBC 0.0 0.0 - 0.2 %    Comment: Performed at [facility], [address] [place], [place] [address]  Troponin I (High Sensitivity)     Status: Abnormal   Collection Time: 12/12/18  9:30 AM  Result Value Ref Range   Troponin I (High Sensitivity) 84 (H) <18 ng/L    Comment: (NOTE) Elevated high sensitivity troponin I (hsTnI) values and significant  changes across serial measurements may suggest ACS but many other  chronic and acute conditions are known to elevate hsTnI results.  Refer to the "Links" section for chest pain algorithms and additional   guidance. Performed at [facility], [address] [place], [place] [address]   Troponin I (High Sensitivity)     Status: Abnormal   Collection Time: 12/12/18 11:30 AM  Result Value Ref Range   Troponin I (High Sensitivity) 92 (H) <18 ng/L    Comment: (NOTE) Elevated high sensitivity troponin I (hsTnI) values and significant  changes across serial measurements may suggest ACS but many other  chronic and acute conditions are known to elevate hsTnI results.  Refer to the "Links" section for chest pain algorithms and additional  guidance. Performed at [facility], [address] [place], [place] [address]   Protime-INR     Status: None   Collection Time: 12/12/18  2:01 PM  Result Value Ref Range   Prothrombin Time 14.4 11.4 - 15.2 seconds   INR 1.1 0.8 - 1.2    Comment: (NOTE) INR goal varies based on device and disease states. Performed at [facility], [address] [place], [place] [address]   APTT     Status: None   Collection Time: 12/12/18  2:01 PM  Result Value Ref Range   aPTT 34 24 - 36 seconds    Comment: Performed at [facility], [address] [place], [place] [address]   Dg Chest Port 1 View  Result Date: 12/12/2018 CLINICAL DATA:  Chest pain EXAM: PORTABLE CHEST 1 VIEW COMPARISON:  09/06/2018 FINDINGS: Cardiac shadow is enlarged but stable. Aortic calcifications are again seen. Increased vascular congestion is noted with mild interstitial edema. Previously seen left basilar pneumonia has cleared. IMPRESSION: Persistent vascular congestion with interstitial edema. No focal infiltrate is noted. Electronically Signed   By: Alcide CleverMark  Lukens M.D.   On: 12/12/2018 09:48    Pending Labs Unresulted Labs (From admission, onward)    Start     Ordered   12/13/18 0500  CBC  Tomorrow morning,   STAT     12/12/18 1355   12/12/18 2200  Heparin level (unfractionated)  Once-Timed,   STAT     12/12/18 1430   12/12/18 1332  SARS  CORONAVIRUS 2 (TAT 6-24 HRS) Nasopharyngeal Nasopharyngeal Swab  (Asymptomatic/Tier 2 Patients Labs)  Once,   STAT    Question Answer Comment  Is this test for diagnosis or screening Screening   Symptomatic for COVID-19 as defined by CDC No  Hospitalized for COVID-19 No   Admitted to ICU for COVID-19 No   Previously tested for COVID-19 No   Resident in a congregate (group) care setting No   Employed in healthcare setting No      12/12/18 1331   Signed and Held  Creatinine, serum  (enoxaparin (LOVENOX)    CrCl >/= 30 ml/min)  Weekly,   R    Comments: while on enoxaparin therapy    Signed and Held   Signed and Held  Hemoglobin A1c  Once,   R    Comments: To assess prior glycemic control    Signed and Held          Vitals/Pain Today's Vitals   12/12/18 1300 12/12/18 1330 12/12/18 1400 12/12/18 1515  BP: (!) 181/81 (!) 178/67 (!) 188/86 (!) 182/82  Pulse: 62 61 66 (!) 52  Resp: 20 18 (!) 21 18  Temp:      TempSrc:      SpO2: 97% 93% 96% 96%  Weight:      Height:      PainSc:    0-No pain    Isolation Precautions No active isolations  Medications Medications  heparin ADULT infusion 100 units/mL (25000 units/220mL sodium chloride 0.45%) (1,250 Units/hr Intravenous New Bag/Given 12/12/18 1413)  heparin bolus via infusion 4,000 Units (4,000 Units Intravenous Bolus from Bag 12/12/18 1414)    Mobility non-ambulatory Low fall risk   Focused Assessments Cardiac Assessment Handoff:  Cardiac Rhythm: Bundle branch block(Difficult to determine rhythm ) Lab Results  Component Value Date   TROPONINI 0.06 (HH) 09/06/2018   No results found for: DDIMER Does the Patient currently have chest pain? No     R Recommendations: See Admitting Provider Note  Report given to:   Additional Notes:

## 2018-12-12 NOTE — ED Triage Notes (Signed)
Pt had central chest pain that woke him out of of sleep this AM with nausea.  No pain at this moment. Unlabored. VSS.  No fevers.  No cough.

## 2018-12-12 NOTE — Consult Note (Signed)
[facility] Cardiology Consultation Note  Patient ID: Gregory Griffin, MRN: [id number], DOB/AGE: 76/10/1940 76 y.o. Admit date: 12/12/2018   Date of Consult: 12/12/2018 [facility] Primary Cardiologist: Gwen PoundsKowalski  Chief Complaint:  Chief Complaint  Patient presents with  . Chest Pain   Reason for Consult: Chest pain with atrial flutter  HPI: 78 y.o. male with known coronary atherosclerosis hypertension hyperlipidemia intermittent second-degree type I AV block diabetes obstructive sleep apnea previously on appropriate medication management and relatively well but recently having more shortness of breath and episodes of chest discomfort and weakness.  The patient was seen in the emergency room with the symptoms with an EKG showing atrial flutter with controlled ventricular rate and left anterior fascicular block.  Additionally chest x-ray showed vascular congestion and a troponin of 92 more consistent with demand ischemia rather than acute coronary syndrome.  The patient was placed on appropriate medications including furosemide for pulmonary edema and oxygen and he has had improvements of chest pain and resolution.  Currently heart rate is controlled with 60 bpm and hemodynamically stable  Past Medical History:  Diagnosis Date  . AV block, 2nd degree   . Benign paroxysmal positional vertigo   . BPH (benign prostatic hyperplasia)   . CAD (coronary artery disease)   . Chronic bronchitis ([facility])   . Diabetes mellitus, type II ([facility])   . GERD (gastroesophageal reflux disease)   . HLD (hyperlipidemia)   . HTN (hypertension)   . OSA (obstructive sleep apnea)   . SOB (shortness of breath)   . Urinary retention   . Valvular heart disease       Surgical History:  Past Surgical History:  Procedure Laterality Date  . BRAIN SURGERY  2009  . CATARACT EXTRACTION Right 2014   Left 2015  . COLECTOMY  06/2011  . REPAIR DURAL / CSF LEAK  2009  . ROTATOR CUFF  REPAIR Left    x 2  . TONSILLECTOMY       Home Meds: Prior to Admission medications   Medication Sig Start Date End Date Taking? Authorizing Provider  aspirin EC 81 MG tablet Take 81 mg by mouth at bedtime.    Yes [provider]  buPROPion (WELLBUTRIN SR) 150 MG 12 hr tablet Take 150 mg by mouth 2 (two) times daily.  08/29/14  Yes [provider]  carbidopa-levodopa (PARCOPA) 25-100 MG disintegrating tablet Take 0.5 tablets by mouth 3 (three) times daily.   Yes [provider]  feeding supplement, GLUCERNA SHAKE, (GLUCERNA SHAKE) LIQD Take 237 mLs by mouth 3 (three) times daily between meals.   Yes [provider]  finasteride (PROSCAR) 5 MG tablet TAKE 1 TABLET BY MOUTH EVERY DAY 03/21/16  Yes McGowan, Carollee HerterShannon A, PA-C  furosemide (LASIX) 20 MG tablet Take 20 mg by mouth daily.  10/26/15  Yes [provider]  lactulose (CHRONULAC) 10 GM/15ML solution Take 10 g by mouth daily as needed.    Yes [provider]  lisinopril (ZESTRIL) 40 MG tablet Take 40 mg by mouth daily.   Yes [provider]  loratadine (CLARITIN) 10 MG tablet Take 10 mg by mouth daily.   Yes [provider]  Multiple Vitamins-Minerals (CENTRUM SILVER ADULT 50+ PO) Take by mouth.   Yes [provider]  Multiple Vitamins-Minerals (PRESERVISION AREDS 2) CAPS Take 1 capsule by mouth 2 (two) times a day.   Yes [provider]  omeprazole (PRILOSEC) 20 MG capsule Take 20 mg by  mouth daily.   Yes [provider]  polyethylene glycol (MIRALAX / GLYCOLAX) 17 g packet Take 17 g by mouth daily.    Yes [provider]  potassium chloride (K-DUR) 10 MEQ tablet TK 1 T PO D PRN WITH FLUID PILL 10/26/15  Yes [provider]  rOPINIRole (REQUIP) 1 MG tablet Take 1 mg by mouth at bedtime.  11/01/16 12/12/18 Yes [provider]  sennosides-docusate sodium (SENOKOT-S) 8.6-50 MG tablet Take 2 tablets by mouth 2 (two) times  daily.   Yes [provider]  simvastatin (ZOCOR) 20 MG tablet Take 20 mg by mouth daily.   Yes [provider]  tamsulosin (FLOMAX) 0.4 MG CAPS capsule TAKE ONE CAPSULE BY MOUTH DAILY 03/21/16  Yes McGowan, [place] HampshireShannon A, PA-C    Inpatient Medications:  . aspirin EC  81 mg Oral QHS  . buPROPion  150 mg Oral BID  . Carbidopa-Levodopa ER  0.5 tablet Oral TID  . feeding supplement (GLUCERNA SHAKE)  237 mL Oral TID BM  . finasteride  5 mg Oral Daily  . furosemide  20 mg Oral Daily  . insulin aspart  0-5 Units Subcutaneous QHS  . insulin aspart  0-9 Units Subcutaneous TID WC  . isosorbide mononitrate  30 mg Oral Daily  . lisinopril  40 mg Oral Daily  . [START ON 12/13/2018] loratadine  10 mg Oral Daily  . [START ON 12/13/2018] pantoprazole  40 mg Oral Daily  . polyethylene glycol  17 g Oral Daily  . rOPINIRole  1 mg Oral QHS  . senna-docusate  2 tablet Oral BID  . simvastatin  20 mg Oral Daily  . tamsulosin  0.4 mg Oral Daily   . heparin 1,250 Units/hr (12/12/18 1623)    Allergies:  Allergies  Allergen Reactions  . Percocet [Oxycodone-Acetaminophen] Nausea And Vomiting    Social History   Socioeconomic History  . Marital status: Married    Spouse name: Not on file  . Number of children: Not on file  . Years of education: Not on file  . Highest education level: Not on file  Occupational History  . Not on file  Social Needs  . Financial resource strain: Not on file  . Food insecurity    Worry: Not on file    Inability: Not on file  . Transportation needs    Medical: Not on file    Non-medical: Not on file  Tobacco Use  . Smoking status: Former Games developermoker  . Smokeless tobacco: Never Used  . Tobacco comment: quit 7 years ago  Substance and Sexual Activity  . Alcohol use: No    Alcohol/week: 0.0 standard drinks    Frequency: Never  . Drug use: No  . Sexual activity: Not on file  Lifestyle  . Physical activity    Days per week: Not on file    Minutes per  session: Not on file  . Stress: Not on file  Relationships  . Social Musicianconnections    Talks on phone: Not on file    Gets together: Not on file    Attends religious service: Not on file    Active member of club or organization: Not on file    Attends meetings of clubs or organizations: Not on file    Relationship status: Not on file  . Intimate partner violence    Fear of current or ex partner: Not on file    Emotionally abused: Not on file    Physically abused: Not on  file    Forced sexual activity: Not on file  Other Topics Concern  . Not on file  Social History Narrative  . Not on file     Family History  Problem Relation Age of Onset  . Stroke Father   . Epilepsy Father   . Heart attack Mother   . Blindness Mother   . Kidney disease Neg Hx   . Prostate cancer Neg Hx   . Kidney cancer Neg Hx   . Bladder Cancer Neg Hx      Review of Systems Positive for chest pain shortness of breath weakness Negative for: General:  chills, fever, night sweats or weight changes.  Cardiovascular: PND orthopnea syncope dizziness  Dermatological skin lesions rashes Respiratory: Cough congestion Urologic: Frequent urination urination at night and hematuria Abdominal: negative for nausea, vomiting, diarrhea, bright red blood per rectum, melena, or hematemesis Neurologic: negative for visual changes, and/or hearing changes  All other systems reviewed and are otherwise negative except as noted above.  Labs: No results for input(s): CKTOTAL, CKMB, TROPONINI in the last 72 hours. Lab Results  Component Value Date   WBC 7.9 12/12/2018   HGB 14.6 12/12/2018   HCT 46.1 12/12/2018   MCV 86.2 12/12/2018   PLT 193 12/12/2018    Recent Labs  Lab 12/12/18 0930  NA 142  K 3.8  CL 106  CO2 25  BUN 19  CREATININE 0.93  CALCIUM 9.1  GLUCOSE 108*   No results found for: CHOL, HDL, LDLCALC, TRIG No results found for: DDIMER  Radiology/Studies:  Dg Chest Port 1 View  Result Date:  12/12/2018 CLINICAL DATA:  Chest pain EXAM: PORTABLE CHEST 1 VIEW COMPARISON:  09/06/2018 FINDINGS: Cardiac shadow is enlarged but stable. Aortic calcifications are again seen. Increased vascular congestion is noted with mild interstitial edema. Previously seen left basilar pneumonia has cleared. IMPRESSION: Persistent vascular congestion with interstitial edema. No focal infiltrate is noted. Electronically Signed   By: Alcide Clever M.D.   On: 12/12/2018 09:48    EKG: Atrial flutter with controlled ventricular rate in block at 41 with left anterior fascicular block  Weights: Filed Weights   12/12/18 0921  Weight: 81.6 kg     Physical Exam: Blood pressure (!) 188/103, pulse 80, temperature 97.7 F (36.5 C), temperature source Oral, resp. rate 19, height 6' (1.829 m), weight 81.6 kg, SpO2 97 %. Body mass index is 24.41 kg/m. General: Well developed, well nourished, in no acute distress. Head eyes ears nose throat: Normocephalic, atraumatic, sclera non-icteric, no xanthomas, nares are without discharge. No apparent thyromegaly and/or mass  Lungs: Normal respiratory effort.  n some wheezes, few rales, no rhonchi.  Heart: Irregular with normal S1 S2. no murmur gallop, no rub, PMI is normal size and placement, carotid upstroke normal without bruit, jugular venous pressure is normal Abdomen: Soft, non-tender, non-distended with normoactive bowel sounds. No hepatomegaly. No rebound/guarding. No obvious abdominal masses. Abdominal aorta is normal size without bruit Extremities: Trace edema. no cyanosis, no clubbing, no ulcers  Peripheral : 2+ bilateral upper extremity pulses, 2+ bilateral femoral pulses, 2+ bilateral dorsal pedal pulse Neuro: Alert and oriented. No facial asymmetry. No focal deficit. Moves all extremities spontaneously. Musculoskeletal: Normal muscle tone without kyphosis Psych:  Responds to questions appropriately with a normal affect.    Assessment: 78 year old male with  diabetes hypertension hyperlipidemia coronary atherosclerosis with acute atrial flutter with slow ventricular rate and abnormal EKG with elevated troponin consistent with demand ischemia and vascular congestion consistent with  congestive heart failure  Plan: 1.  Continue furosemide for treatment of acute systolic dysfunction heart failure and pulmonary edema 2.  Decrease beta-blocker due to concerns of slow heart rate and follow for need for 6 further intervention including a pacemaker depending on heart block and ventricular rate 3.  ACE inhibitor for hypertension control 4.  Echocardiogram for LV systolic dysfunction valvular heart disease and treatment thereof is necessary 5.  No further intervention at this time due to minimal elevation of troponin consistent with demand ischemia rather than acute coronary syndrome  Signed, Corey Skains M.D. [facility] 12/12/2018, 5:50 PM

## 2018-12-12 NOTE — ED Provider Notes (Signed)
[facility] Emergency Department Provider Note       Time seen: ----------------------------------------- 9:21 AM on 12/12/2018 -----------------------------------------   I have reviewed the triage vital signs and the nursing notes.  HISTORY   Chief Complaint Chest Pain    HPI Gregory Griffin is a 78 y.o. male with a history of coronary artery disease, diabetes, GERD, hyperlipidemia who presents to the ED for chest pain.  EMS states patient has had intermittent chest pain for weeks.  Patient had pain that woke him up this morning.  Currently he is chest pain-free.  He denies any recent illness or other complaints at this time.  Past Medical History:  Diagnosis Date  . AV block, 2nd degree   . Benign paroxysmal positional vertigo   . BPH (benign prostatic hyperplasia)   . CAD (coronary artery disease)   . Chronic bronchitis (HCC)   . Diabetes mellitus, type II (HCC)   . GERD (gastroesophageal reflux disease)   . HLD (hyperlipidemia)   . HTN (hypertension)   . OSA (obstructive sleep apnea)   . SOB (shortness of breath)   . Urinary retention   . Valvular heart disease     Patient Active Problem List   Diagnosis Date Noted  . Benign essential hypertension 12/25/2014  . Elevated PSA 12/22/2014  . BPH with obstruction/lower urinary tract symptoms 12/22/2014  . Health care maintenance 11/20/2014  . Parkinson's disease (HCC) 10/03/2014  . Postoperative CSF leak 10/03/2014  . 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 CAD (coronary artery disease) 10/27/2013  . COPD (chronic obstructive pulmonary disease) (HCC) 10/27/2013  . DM (diabetes mellitus) (HCC) 10/27/2013  . OSA (obstructive sleep apnea) 10/27/2013  . Tremor 10/27/2013  . SOB (shortness of breath) 09/17/2013  . GERD (gastroesophageal reflux disease) 09/13/2013  . Mixed hyperlipidemia  09/13/2013  . Second degree AV block 09/13/2013    Past Surgical History:  Procedure Laterality Date  . BRAIN SURGERY  2009  . CATARACT EXTRACTION Right 2014   Left 2015  . COLECTOMY  06/2011  . REPAIR DURAL / CSF LEAK  2009  . ROTATOR CUFF REPAIR Left    x 2  . TONSILLECTOMY      Allergies Percocet [oxycodone-acetaminophen]  Social History Social History   Tobacco Use  . Smoking status: Former Games developermoker  . Smokeless tobacco: Never Used  . Tobacco comment: quit 7 years ago  Substance Use Topics  . Alcohol use: No    Alcohol/week: 0.0 standard drinks    Frequency: Never  . Drug use: No   Review of Systems Constitutional: Negative for fever. Cardiovascular: Positive for recent chest pain Respiratory: Negative for shortness of breath. Gastrointestinal: Negative for abdominal pain, vomiting and diarrhea. Musculoskeletal: Negative for back pain. Skin: Negative for rash. Neurological: Negative for headaches, focal weakness or numbness.  All systems negative/normal/unremarkable except as stated in the HPI  ____________________________________________   PHYSICAL EXAM:  VITAL SIGNS: ED Triage Vitals [12/12/18 0920]  Enc Vitals Group     BP      Pulse      Resp      Temp      Temp src      SpO2      Weight      Height      Head Circumference      Peak Flow      Pain Score 0     Pain  Loc      Pain Edu?      Excl. in GC?    Constitutional: Alert, chronically ill-appearing, no distress Eyes: Conjunctivae are normal. Normal extraocular movements. ENT      Head: Normocephalic and atraumatic.      Nose: No congestion/rhinnorhea.      Mouth/Throat: Mucous membranes are moist.      Neck: No stridor. Cardiovascular: Normal rate, regular rhythm. No murmurs, rubs, or gallops. Respiratory: Normal respiratory effort without tachypnea nor retractions. Breath sounds are clear and equal bilaterally. No wheezes/rales/rhonchi. Gastrointestinal: Soft and nontender. Normal  bowel sounds Musculoskeletal: Nontender with normal range of motion in extremities. No lower extremity tenderness nor edema. Neurologic:  Normal speech and language. No gross focal neurologic deficits are appreciated.  Skin:  Skin is warm, dry and intact. No rash noted. Psychiatric: Mood and affect are normal. Speech and behavior are normal.  ____________________________________________  EKG: Interpreted by me.  Indeterminate rhythm, rate is 62 bpm, PVC, right bundle branch block, no ST elevation  Repeat EKG, indeterminate rhythm with a rate of 44 bpm, PVC, right bundle branch block ____________________________________________  ED COURSE:  As part of my medical decision making, I reviewed the following data within the electronic MEDICAL RECORD NUMBER History obtained from family if available, nursing notes, old chart and ekg, as well as notes from prior ED visits. Patient presented for chest pain, we will assess with labs and imaging as indicated at this time.   Procedures  Gregory Griffin was evaluated in Emergency Department on 12/12/2018 for the symptoms described in the history of present illness. He was evaluated in the context of the global COVID-19 pandemic, which necessitated consideration that the patient might be at risk for infection with the SARS-CoV-2 virus that causes COVID-19. Institutional protocols and algorithms that pertain to the evaluation of patients at risk for COVID-19 are in a state of rapid change based on information released by regulatory bodies including the CDC and federal and state organizations. These policies and algorithms were followed during the patient's care in the ED.  ____________________________________________   LABS (pertinent positives/negatives)  Labs Reviewed  BASIC METABOLIC PANEL - Abnormal; Notable for the following components:      Result Value   Glucose, Bld 108 (*)    All other components within normal limits  CBC - Abnormal; Notable for  the following components:   RDW 18.0 (*)    All other components within normal limits  TROPONIN I (HIGH SENSITIVITY) - Abnormal; Notable for the following components:   Troponin I (High Sensitivity) 84 (*)    All other components within normal limits  TROPONIN I (HIGH SENSITIVITY) - Abnormal; Notable for the following components:   Troponin I (High Sensitivity) 92 (*)    All other components within normal limits    RADIOLOGY Images were viewed by me  Chest x-ray  IMPRESSION:  Persistent vascular congestion with interstitial edema. No focal  infiltrate is noted.  ____________________________________________   DIFFERENTIAL DIAGNOSIS   Musculoskeletal pain, GERD, anxiety, angina, PE  FINAL ASSESSMENT AND PLAN  Chest pain, elevated troponin   Plan: The patient had presented for nonspecific chest pain. Patient's labs did reveal increasing troponin concerning for ischemia.  His EKG may reveal some atrial fibrillation which I believe is a new onset for him.. Patient's imaging revealed some interstitial edema.  In light of these findings, I will discuss with the hospitalist for admission.   Ulice Dash, MD    Note: This  note was generated in part or whole with voice recognition software. Voice recognition is usually quite accurate but there are transcription errors that can and very often do occur. I apologize for any typographical errors that were not detected and corrected.     Earleen Newport, MD 12/12/18 [phone]

## 2018-12-12 NOTE — ED Notes (Signed)
Pt unlabored.  NAD. Waiting on admit bed. Remains pain free.

## 2018-12-12 NOTE — Progress Notes (Addendum)
Advanced Care Plan.  Purpose of Encounter: CODE STATUS. Parties in Attendance: The patient and me. Patient's Decisional Capacity: Yes. Medical Story: Gregory Griffin  is a 77 y.o. male with a known history of hypertension, diabetes, CAD, chronic bronchitis, OSA, BPH etc. the patient is being admitted for chest pain and a new onset A. fib.  Discussed with patient about his current condition, prognosis and CODE STATUS.  The patient does not want to be resuscitated or intubated if he has cardiopulmonary arrest. Plan:  Code Status: DNR.  Time spent discussing advance care planning: 17 minutes.

## 2018-12-12 NOTE — Progress Notes (Addendum)
ANTICOAGULATION CONSULT NOTE - Initial Consult  Pharmacy Consult for Heparin drip Indication: atrial fibrillation  Allergies  Allergen Reactions  . Percocet [Oxycodone-Acetaminophen] Nausea And Vomiting    Patient Measurements: Height: 6' (182.9 cm) Weight: 180 lb (81.6 kg) IBW/kg (Calculated) : 77.6 Heparin Dosing Weight: 81.6 kg  Vital Signs: Temp: 97.8 F (36.6 C) (09/09 0925) Temp Source: Oral (09/09 0925) BP: 178/67 (09/09 1330) Pulse Rate: 61 (09/09 1330)  Labs: Recent Labs    12/12/18 0930 12/12/18 1130  HGB 14.6  --   HCT 46.1  --   PLT 193  --   CREATININE 0.93  --   TROPONINIHS 84* 92*    Estimated Creatinine Clearance: 75.3 mL/min (by C-G formula based on SCr of 0.93 mg/dL).   Medical History: Past Medical History:  Diagnosis Date  . AV block, 2nd degree   . Benign paroxysmal positional vertigo   . BPH (benign prostatic hyperplasia)   . CAD (coronary artery disease)   . Chronic bronchitis ([place])   . Diabetes mellitus, type II ([place])   . GERD (gastroesophageal reflux disease)   . HLD (hyperlipidemia)   . HTN (hypertension)   . OSA (obstructive sleep apnea)   . SOB (shortness of breath)   . Urinary retention   . Valvular heart disease     Medications:  Scheduled:  . heparin  4,000 Units Intravenous Once   Infusions:  . heparin      Assessment: 78 yo M with new onset Afib to start Heparin drip. Patient takes aspirin PTA. Hgb 14.6  Plt 193  APTT 34  INR 1.1  Goal of Therapy:  Heparin level 0.3-0.7 units/ml Monitor platelets by anticoagulation protocol: Yes   Plan:  Give 4000 units bolus x 1 Start heparin infusion at 1250 units/hr Check anti-Xa level in 8 hours and daily while on heparin Continue to monitor H&H and platelets  Delma Drone A 12/12/2018,1:56 PM

## 2018-12-13 ENCOUNTER — Observation Stay
Admit: 2018-12-13 | Discharge: 2018-12-13 | Disposition: A | Discharge status: Home / Self Care | Payer: No Typology Code available for payment source | Attending: Internal Medicine | Admitting: Internal Medicine

## 2018-12-13 LAB — CBC
HCT: 38.8 % — ABNORMAL LOW (ref 39.0–52.0)
Hemoglobin: 12.7 g/dL — ABNORMAL LOW (ref 13.0–17.0)
MCH: 27.6 pg (ref 26.0–34.0)
MCHC: 32.7 g/dL (ref 30.0–36.0)
MCV: 84.3 fL (ref 80.0–100.0)
Platelets: 196 10*3/uL (ref 150–400)
RBC: 4.6 MIL/uL (ref 4.22–5.81)
RDW: 17.6 % — ABNORMAL HIGH (ref 11.5–15.5)
WBC: 9.4 10*3/uL (ref 4.0–10.5)
nRBC: 0 % (ref 0.0–0.2)

## 2018-12-13 LAB — GLUCOSE, CAPILLARY
Glucose-Capillary: 108 mg/dL — ABNORMAL HIGH (ref 70–99)
Glucose-Capillary: 135 mg/dL — ABNORMAL HIGH (ref 70–99)
Glucose-Capillary: 87 mg/dL (ref 70–99)

## 2018-12-13 LAB — HEMOGLOBIN A1C
Hgb A1c MFr Bld: 6.3 % — ABNORMAL HIGH (ref 4.8–5.6)
Mean Plasma Glucose: 134 mg/dL

## 2018-12-13 LAB — HEPARIN LEVEL (UNFRACTIONATED): Heparin Unfractionated: 0.23 IU/mL — ABNORMAL LOW (ref 0.30–0.70)

## 2018-12-13 LAB — SARS CORONAVIRUS 2 (TAT 6-24 HRS): SARS Coronavirus 2: NEGATIVE

## 2018-12-13 MED ORDER — ENOXAPARIN SODIUM 40 MG/0.4ML ~~LOC~~ SOLN: 40.0000 mg | SUBCUTANEOUS | Status: DC

## 2018-12-13 MED ORDER — FUROSEMIDE 40 MG PO TABS
40.0000 mg | ORAL_TABLET | Freq: Every day | ORAL | Status: DC
  Administered 2018-12-13: 09:00:00 40 mg via ORAL
  Filled 2018-12-13: qty 1

## 2018-12-13 MED ORDER — APIXABAN 5 MG PO TABS
5.0000 mg | ORAL_TABLET | Freq: Two times a day (BID) | ORAL | Status: DC
  Administered 2018-12-13: 5 mg via ORAL
  Filled 2018-12-13: qty 1

## 2018-12-13 MED ORDER — APIXABAN 5 MG PO TABS: 5.0000 mg | ORAL_TABLET | Freq: Two times a day (BID) | ORAL | 1 refills | Status: AC

## 2018-12-13 MED ORDER — ISOSORBIDE MONONITRATE ER 30 MG PO TB24: 30.0000 mg | ORAL_TABLET | Freq: Every day | ORAL | 0 refills | Status: AC

## 2018-12-13 MED ORDER — HEPARIN BOLUS VIA INFUSION
1200.0000 [IU] | Freq: Once | INTRAVENOUS | Status: AC
  Administered 2018-12-13: 1200 [IU] via INTRAVENOUS
  Filled 2018-12-13: qty 1200

## 2018-12-13 NOTE — Plan of Care (Signed)
  Problem: Clinical Measurements: Goal: Ability to maintain clinical measurements within normal limits will improve Outcome: Progressing Goal: Will remain free from infection Outcome: Progressing Goal: Respiratory complications will improve Outcome: Progressing Goal: Cardiovascular complication will be avoided Outcome: Progressing   Problem: Pain Managment: Goal: General experience of comfort will improve Outcome: Progressing   Problem: Cardiac: Goal: Ability to achieve and maintain adequate cardiopulmonary perfusion will improve Outcome: Progressing

## 2018-12-13 NOTE — Consult Note (Signed)
[facility] for apixaban Indication: atrial fibrillation  Allergies  Allergen Reactions  . Percocet [Oxycodone-Acetaminophen] Nausea And Vomiting    Patient Measurements: Height: 6' (182.9 cm) Weight: 167 lb 6.4 oz (75.9 kg) IBW/kg (Calculated) : 77.6  Vital Signs: Temp: 98.3 F (36.8 C) (09/10 0720) Temp Source: Oral (09/10 0720) BP: 139/51 (09/10 0720) Pulse Rate: 46 (09/10 0720)  Labs: Recent Labs    12/12/18 0930 12/12/18 1130 12/12/18 1401 12/12/18 1623 12/12/18 2251 12/13/18 0524  HGB 14.6  --   --   --   --  12.7*  HCT 46.1  --   --   --   --  38.8*  PLT 193  --   --   --   --  196  APTT  --   --  34  --   --   --   LABPROT  --   --  14.4  --   --   --   INR  --   --  1.1  --   --   --   HEPARINUNFRC  --   --   --   --  0.23*  --   CREATININE 0.93  --   --   --   --   --   TROPONINIHS 84* 92*  --  97*  --   --     Estimated Creatinine Clearance: 73.7 mL/min (by C-G formula based on SCr of 0.93 mg/dL).   Medical History: Past Medical History:  Diagnosis Date  . AV block, 2nd degree   . Benign paroxysmal positional vertigo   . BPH (benign prostatic hyperplasia)   . CAD (coronary artery disease)   . Chronic bronchitis ([place])   . Diabetes mellitus, type II ([place])   . GERD (gastroesophageal reflux disease)   . HLD (hyperlipidemia)   . HTN (hypertension)   . OSA (obstructive sleep apnea)   . SOB (shortness of breath)   . Urinary retention   . Valvular heart disease     Medications:  Medications Prior to Admission  Medication Sig Dispense Refill Last Dose  . aspirin EC 81 MG tablet Take 81 mg by mouth at bedtime.    12/11/2018 at Unknown time  . buPROPion (WELLBUTRIN SR) 150 MG 12 hr tablet Take 150 mg by mouth 2 (two) times daily.    12/11/2018 at Unknown time  . carbidopa-levodopa (PARCOPA) 25-100 MG disintegrating tablet Take 0.5 tablets by mouth 3 (three) times daily.   12/11/2018 at Unknown time  . feeding  supplement, GLUCERNA SHAKE, (GLUCERNA SHAKE) LIQD Take 237 mLs by mouth 3 (three) times daily between meals.   12/11/2018 at Unknown time  . finasteride (PROSCAR) 5 MG tablet TAKE 1 TABLET BY MOUTH EVERY DAY 30 tablet 2 12/11/2018 at Unknown time  . furosemide (LASIX) 20 MG tablet Take 20 mg by mouth daily.   11 12/11/2018 at Unknown time  . lactulose (CHRONULAC) 10 GM/15ML solution Take 10 g by mouth daily as needed.    prn at prn  . lisinopril (ZESTRIL) 40 MG tablet Take 40 mg by mouth daily.   12/11/2018 at Unknown time  . loratadine (CLARITIN) 10 MG tablet Take 10 mg by mouth daily.   12/11/2018 at Unknown time  . Multiple Vitamins-Minerals (CENTRUM SILVER ADULT 50+ PO) Take by mouth.   12/11/2018 at Unknown time  . Multiple Vitamins-Minerals (PRESERVISION AREDS 2) CAPS Take 1 capsule by mouth 2 (two) times a day.  12/11/2018 at Unknown time  . omeprazole (PRILOSEC) 20 MG capsule Take 20 mg by mouth daily.   12/11/2018 at Unknown time  . polyethylene glycol (MIRALAX / GLYCOLAX) 17 g packet Take 17 g by mouth daily.    12/11/2018 at Unknown time  . potassium chloride (K-DUR) 10 MEQ tablet TK 1 T PO D PRN WITH FLUID PILL  11 12/11/2018 at Unknown time  . rOPINIRole (REQUIP) 1 MG tablet Take 1 mg by mouth at bedtime.    12/11/2018 at Unknown time  . sennosides-docusate sodium (SENOKOT-S) 8.6-50 MG tablet Take 2 tablets by mouth 2 (two) times daily.   12/11/2018 at Unknown time  . simvastatin (ZOCOR) 20 MG tablet Take 20 mg by mouth daily.   12/11/2018 at Unknown time  . tamsulosin (FLOMAX) 0.4 MG CAPS capsule TAKE ONE CAPSULE BY MOUTH DAILY 30 capsule 2 12/11/2018 at Unknown time   Scheduled:  . aspirin EC  81 mg Oral QHS  . buPROPion  150 mg Oral BID  . Carbidopa-Levodopa ER  0.5 tablet Oral TID  . feeding supplement (GLUCERNA SHAKE)  237 mL Oral TID BM  . finasteride  5 mg Oral Daily  . furosemide  40 mg Oral Daily  . insulin aspart  0-5 Units Subcutaneous QHS  . insulin aspart  0-9 Units Subcutaneous TID WC  .  isosorbide mononitrate  30 mg Oral Daily  . lisinopril  40 mg Oral Daily  . loratadine  10 mg Oral Daily  . pantoprazole  40 mg Oral Daily  . pneumococcal 23 valent vaccine  0.5 mL Intramuscular Tomorrow-1000  . polyethylene glycol  17 g Oral Daily  . rOPINIRole  1 mg Oral QHS  . senna-docusate  2 tablet Oral BID  . simvastatin  20 mg Oral Daily  . sodium chloride flush  10 mL Intravenous Q12H  . tamsulosin  0.4 mg Oral Daily   Infusions:   PRN: acetaminophen, ALPRAZolam, hydrALAZINE, lactulose, ondansetron (ZOFRAN) IV, traMADol, zolpidem Anti-infectives (From admission, onward)   None      Assessment: Pt has new onset of aflutter. Pharmacy consulted to start apixaban. Pt does not meet criteria for a dose reduction.   Goal of Therapy:  Monitor platelets by anticoagulation protocol: Yes   Plan:  Will start apixaban 5 mg BID.   Ronnald RampKishan S Bich Mchaney, PharmD, BCPS 12/13/2018,8:54 AM

## 2018-12-13 NOTE — Plan of Care (Signed)

## 2018-12-13 NOTE — Progress Notes (Signed)
New referral for [place] outpatient Palliative to followed at home, received from [facility]. Plan is for discharge home today with [facility]. Patient information faxed to referral. Flo Shanks BSN, RN, [place] [phone]

## 2018-12-13 NOTE — TOC Benefit Eligibility Note (Signed)
Transition of Care [facility]) Benefit Eligibility Note    Patient Details  Name: Gregory Griffin MRN: [id number] Date of Birth: 09/12/1940   Medication/Dose: ELIQUIS  5 MG  BID    AND ELIQUID 2.5 MG BID  Covered?: (NON-FORMULARY)     Prescription Coverage [facility]: [facility]  Spoke with Person/Company/Phone Number:: [facility]   @  [place] [facility] # [phone]     Prior Approval: [phone])     Additional Notes: PATIENT ALSO HAS VA BENEFITS    Memory Argue Phone Number: 12/13/2018, 10:18 AM

## 2018-12-13 NOTE — Discharge Summary (Addendum)
[facility] [place] at [facility]   PATIENT NAME: Gregory Griffin    MR#:  [id number]  DATE OF BIRTH:  02/26/1941  DATE OF ADMISSION:  12/12/2018   ADMITTING PHYSICIAN: Shaune PollackQing Johncarlo Maalouf, MD  DATE OF DISCHARGE: 12/13/2018 [facility]   ADMISSION DIAGNOSIS:  Elevated troponin I level [R79.89] Nonspecific chest pain [R07.9] DISCHARGE DIAGNOSIS:  Active Problems:   Chest pain  SECONDARY DIAGNOSIS:   Past Medical History:  Diagnosis Date  . AV block, 2nd degree   . Benign paroxysmal positional vertigo   . BPH (benign prostatic hyperplasia)   . CAD (coronary artery disease)   . Chronic bronchitis (HCC)   . Diabetes mellitus, type II (HCC)   . GERD (gastroesophageal reflux disease)   . HLD (hyperlipidemia)   . HTN (hypertension)   . OSA (obstructive sleep apnea)   . SOB (shortness of breath)   . Urinary retention   . Valvular heart disease    HOSPITAL COURSE:   Chest pain with elevated troponin and new onset A. Fib. The patient was treated with heparin drip, aspirin and statin.  Start Eliquis per  per Dr. Gwen PoundsKowalski.  Acute systolic CHF with demanding ischemia Continue Lasix and follow-up as outpatient per Dr. Gwen PoundsKowalski.  Hypertension.  Continue home hypertension medication.  Added Imdur. Diabetes.    He is treated with sliding scale. Generalized weakness.  Home health and PT.  Discussed with Dr. Gwen PoundsKowalski. out patient palliative follow-up. DISCHARGE CONDITIONS:  Stable, discharge to home with home health and PT today. CONSULTS OBTAINED:   DRUG ALLERGIES:   Allergies  Allergen Reactions  . Percocet [Oxycodone-Acetaminophen] Nausea And Vomiting   DISCHARGE MEDICATIONS:   Allergies as of 12/13/2018      Reactions   Percocet [oxycodone-acetaminophen] Nausea And Vomiting      Medication List    TAKE these medications   apixaban 5 MG Tabs tablet Commonly known as: ELIQUIS Take 1 tablet (5 mg total) by mouth 2  (two) times daily.   aspirin EC 81 MG tablet Take 81 mg by mouth at bedtime.   buPROPion 150 MG 12 hr tablet Commonly known as: WELLBUTRIN SR Take 150 mg by mouth 2 (two) times daily.   carbidopa-levodopa 25-100 MG disintegrating tablet Commonly known as: PARCOPA Take 0.5 tablets by mouth 3 (three) times daily.   CENTRUM SILVER ADULT 50+ PO Take by mouth.   PreserVision AREDS 2 Caps Take 1 capsule by mouth 2 (two) times a day.   feeding supplement (GLUCERNA SHAKE) Liqd Take 237 mLs by mouth 3 (three) times daily between meals.   finasteride 5 MG tablet Commonly known as: PROSCAR TAKE 1 TABLET BY MOUTH EVERY DAY   furosemide 20 MG tablet Commonly known as: LASIX Take 20 mg by mouth daily.   isosorbide mononitrate 30 MG 24 hr tablet Commonly known as: IMDUR Take 1 tablet (30 mg total) by mouth daily. Start taking on: December 14, 2018   lactulose 10 GM/15ML solution Commonly known as: CHRONULAC Take 10 g by mouth daily as needed.   lisinopril 40 MG tablet Commonly known as: ZESTRIL Take 40 mg by mouth daily.   loratadine 10 MG tablet Commonly known as: CLARITIN Take 10 mg by mouth daily.   omeprazole 20 MG capsule Commonly known as: PRILOSEC Take 20 mg by mouth daily.   polyethylene glycol 17 g packet Commonly known as: MIRALAX / GLYCOLAX Take 17 g by mouth daily.   potassium chloride 10 MEQ tablet  Commonly known as: K-DUR TK 1 T PO D PRN WITH FLUID PILL   rOPINIRole 1 MG tablet Commonly known as: REQUIP Take 1 mg by mouth at bedtime.   sennosides-docusate sodium 8.6-50 MG tablet Commonly known as: SENOKOT-S Take 2 tablets by mouth 2 (two) times daily.   simvastatin 20 MG tablet Commonly known as: ZOCOR Take 20 mg by mouth daily.   tamsulosin 0.4 MG Caps capsule Commonly known as: FLOMAX TAKE ONE CAPSULE BY MOUTH DAILY        DISCHARGE INSTRUCTIONS:  See AVS.  If you experience worsening of your admission symptoms, develop shortness of  breath, life threatening emergency, suicidal or homicidal thoughts you must seek medical attention immediately by calling 911 or calling your MD immediately  if symptoms less severe.  You Must read complete instructions/literature along with all the possible adverse reactions/side effects for all the Medicines you take and that have been prescribed to you. Take any new Medicines after you have completely understood and accpet all the possible adverse reactions/side effects.   Please note  You were cared for by a hospitalist during your hospital stay. If you have any questions about your discharge medications or the care you received while you were in the hospital after you are discharged, you can call the unit and asked to speak with the hospitalist on call if the hospitalist that took care of you is not available. Once you are discharged, your primary care physician will handle any further medical issues. Please note that NO REFILLS for any discharge medications will be authorized once you are discharged, as it is imperative that you return to your primary care physician (or establish a relationship with a primary care physician if you do not have one) for your aftercare needs so that they can reassess your need for medications and monitor your lab values.    On the day of Discharge:  VITAL SIGNS:  Blood pressure (!) 139/51, pulse (!) 46, temperature 98.3 F (36.8 C), temperature source Oral, resp. rate 19, height 6' (1.829 m), weight 75.9 kg, SpO2 93 %. PHYSICAL EXAMINATION:  GENERAL:  78 y.o.-year-old patient lying in the bed with no acute distress.  EYES: Pupils equal, round, reactive to light and accommodation. No scleral icterus. Extraocular muscles intact.  HEENT: Head atraumatic, normocephalic. Oropharynx and nasopharynx clear.  NECK:  Supple, no jugular venous distention. No thyroid enlargement, no tenderness.  LUNGS: Normal breath sounds bilaterally, no wheezing, rales,rhonchi or  crepitation. No use of accessory muscles of respiration.  CARDIOVASCULAR: S1, S2 normal. No murmurs, rubs, or gallops.  ABDOMEN: Soft, non-tender, non-distended. Bowel sounds present. No organomegaly or mass.  EXTREMITIES: No pedal edema, cyanosis, or clubbing.  NEUROLOGIC: Cranial nerves II through XII are intact. Muscle strength 3-4/5 in all extremities. Sensation intact. Gait not checked.  PSYCHIATRIC: The patient is alert and oriented x 3.  SKIN: No obvious rash, lesion, or ulcer.  DATA REVIEW:   CBC Recent Labs  Lab 12/13/18 0524  WBC 9.4  HGB 12.7*  HCT 38.8*  PLT 196    Chemistries  Recent Labs  Lab 12/12/18 0930  NA 142  K 3.8  CL 106  CO2 25  GLUCOSE 108*  BUN 19  CREATININE 0.93  CALCIUM 9.1     Microbiology Results  Results for orders placed or performed during the hospital encounter of 12/12/18  SARS CORONAVIRUS 2 (TAT 6-24 HRS) Nasopharyngeal Nasopharyngeal Swab     Status: None   Collection Time: 12/12/18  2:27 PM   Specimen: Nasopharyngeal Swab  Result Value Ref Range Status   SARS Coronavirus 2 NEGATIVE NEGATIVE Final    Comment: (NOTE) SARS-CoV-2 target nucleic acids are NOT DETECTED. The SARS-CoV-2 RNA is generally detectable in upper and lower respiratory specimens during the acute phase of infection. Negative results do not preclude SARS-CoV-2 infection, do not rule out co-infections with other pathogens, and should not be used as the sole basis for treatment or other patient management decisions. Negative results must be combined with clinical observations, patient history, and epidemiological information. The expected result is Negative. Fact Sheet for Patients: HairSlick.no Fact Sheet for Healthcare Providers: quierodirigir.com This test is not yet approved or cleared by the [place] FDA and  has been authorized for detection and/or diagnosis of SARS-CoV-2 by FDA under an  Emergency Use Authorization (EUA). This EUA will remain  in effect (meaning this test can be used) for the duration of the COVID-19 declaration under Section 56 4(b)(1) of the Act, 21 U.S.C. section 360bbb-3(b)(1), unless the authorization is terminated or revoked sooner. Performed at [facility], [address] [place], [place] [address]     RADIOLOGY:  No results found.   Management plans discussed with the patient, family and they are in agreement.  CODE STATUS: DNR   TOTAL TIME TAKING CARE OF THIS PATIENT: 28 minutes.    Shaune Pollack M.D on 12/13/2018 at 12:30 PM  Between 7am to 6pm - Pager - [phone]  After 6pm go to [email or web address] - Social research officer, government  [facility] [place] [facility]  Office  [phone]  CC: Primary care physician; [facility], [facility]   Note: This dictation was prepared with Dragon dictation along with smaller phrase technology. Any transcriptional errors that result from this process are unintentional.

## 2018-12-13 NOTE — Progress Notes (Signed)
*  PRELIMINARY RESULTS* Echocardiogram 2D Echocardiogram has been performed.  Gregory Griffin 12/13/2018, 2:39 PM

## 2018-12-13 NOTE — Care Management Obs Status (Signed)
[facility] NOTIFICATION   Patient Details  Name: KENZO OZMENT MRN: [id number] Date of Birth: 07/10/1940   Medicare Observation Status Notification Given:  Yes    Elza Rafter, RN 12/13/2018, 1:49 PM

## 2018-12-13 NOTE — Progress Notes (Signed)
ANTICOAGULATION CONSULT NOTE - Initial Consult  Pharmacy Consult for Heparin drip Indication: atrial fibrillation  Allergies  Allergen Reactions  . Percocet [Oxycodone-Acetaminophen] Nausea And Vomiting    Patient Measurements: Height: 6' (182.9 cm) Weight: 180 lb (81.6 kg) IBW/kg (Calculated) : 77.6 Heparin Dosing Weight: 81.6 kg  Vital Signs: Temp: 98.2 F (36.8 C) (09/09 1922) Temp Source: Oral (09/09 1922) BP: 155/65 (09/09 1922) Pulse Rate: 71 (09/09 1922)  Labs: Recent Labs    12/12/18 0930 12/12/18 1130 12/12/18 1401 12/12/18 1623 12/12/18 2251  HGB 14.6  --   --   --   --   HCT 46.1  --   --   --   --   PLT 193  --   --   --   --   APTT  --   --  34  --   --   LABPROT  --   --  14.4  --   --   INR  --   --  1.1  --   --   HEPARINUNFRC  --   --   --   --  0.23*  CREATININE 0.93  --   --   --   --   TROPONINIHS 84* 92*  --  97*  --     Estimated Creatinine Clearance: 75.3 mL/min (by C-G formula based on SCr of 0.93 mg/dL).   Medical History: Past Medical History:  Diagnosis Date  . AV block, 2nd degree   . Benign paroxysmal positional vertigo   . BPH (benign prostatic hyperplasia)   . CAD (coronary artery disease)   . Chronic bronchitis ([place])   . Diabetes mellitus, type II ([place])   . GERD (gastroesophageal reflux disease)   . HLD (hyperlipidemia)   . HTN (hypertension)   . OSA (obstructive sleep apnea)   . SOB (shortness of breath)   . Urinary retention   . Valvular heart disease     Medications:  Scheduled:  . aspirin EC  81 mg Oral QHS  . buPROPion  150 mg Oral BID  . Carbidopa-Levodopa ER  0.5 tablet Oral TID  . feeding supplement (GLUCERNA SHAKE)  237 mL Oral TID BM  . finasteride  5 mg Oral Daily  . furosemide  20 mg Oral Daily  . heparin  1,200 Units Intravenous Once  . insulin aspart  0-5 Units Subcutaneous QHS  . insulin aspart  0-9 Units Subcutaneous TID WC  . isosorbide mononitrate  30 mg Oral Daily  . lisinopril  40 mg Oral  Daily  . loratadine  10 mg Oral Daily  . pantoprazole  40 mg Oral Daily  . pneumococcal 23 valent vaccine  0.5 mL Intramuscular Tomorrow-1000  . polyethylene glycol  17 g Oral Daily  . rOPINIRole  1 mg Oral QHS  . senna-docusate  2 tablet Oral BID  . simvastatin  20 mg Oral Daily  . sodium chloride flush  10 mL Intravenous Q12H  . tamsulosin  0.4 mg Oral Daily   Infusions:  . heparin 1,250 Units/hr (12/12/18 1623)    Assessment: 78 yo M with new onset Afib to start Heparin drip. Patient takes aspirin PTA. Hgb 14.6  Plt 193  APTT 34  INR 1.1  Goal of Therapy:  Heparin level 0.3-0.7 units/ml Monitor platelets by anticoagulation protocol: Yes   Plan:  09/09 @ 2300 HL 0.23 subtherapeutic. Will rebolus w/ heparin 1200 units IV x 1 and increase rate to 1400 units/hr and will recheck HL @  1000, CBC f/u w/ am labs.  Thomasene Rippleavid Gabriella Woodhead, PharmD, BCPS Clinical Pharmacist 12/13/2018,1:49 AM

## 2018-12-13 NOTE — Discharge Instructions (Signed)
out patient palliative follow up HHPT

## 2018-12-13 NOTE — TOC Transition Note (Signed)
Transition of Care [facility]) - CM/SW Discharge Note   Patient Details  Name: PHI AVANS MRN: [id number] Date of Birth: February 09, 1941  Transition of Care [facility]) CM/SW Contact:  Elza Rafter, RN Phone Number: 12/13/2018, 10:11 AM   Clinical Narrative:   Patient is from home with spouse Vaughan Basta.  Admitted with CP and starting on 5mg  Eliquis BID.  30 day free coupon provided.  Asked MD to send script to [facility] fax [phone].  First 30 days will be picked up at local pharmacy with hard copy script.  Notified [facility] of patient in [place] and discharging today.  MOON presented to wife Vaughan Basta over the phone as patient asked that I speak to [place].  Patient is wheelchair bound with Parkinson's.  He has a wheelchair, transport chair.  He has a caregiver in the am and pm provided by the [place] and private caregivers as well.  Offered home health and Vaughan Basta states they have used [facility] in the past and would like them again.  [place] with [facility] has accepted patient for RN, PT, OT and aide.  Asked MD for out patient palliative.  Santiago Glad with [facility] is aware and has accepted.  Patient will discharge today via EMS to home.  Vaughan Basta will be home all day and this RNCM will notify her when patient discharges.  Discharge Packet with EMS transport documents on chart.       Final next level of care: [place] Barriers to Discharge: Continued Medical Work up   Patient Goals and CMS Choice Patient states their goals for this hospitalization and ongoing recovery are:: to go home with wife CMS Medicare.gov Compare Post Acute Care list provided to:: Other (Comment Required)(wife Vaughan Basta) Choice offered to / list presented to : Patient, Spouse  Discharge Placement                    Patient and family notified of of transfer: 12/13/18  Discharge Plan and Services   Discharge Planning Services: CM Consult, Medication Assistance Post Acute Care Choice: Home Health                     HH Arranged: RN, PT, OT, Nurse's Aide [facility] Agency: [facility] Date [facility] Agency Contacted: 12/13/18 Time HH Agency Contacted: 0830 Representative spoke with at [place]: [place] [facility]) Interventions     Readmission Risk Interventions No flowsheet data found.

## 2018-12-13 NOTE — Progress Notes (Signed)
[facility]  Patient: Gregory Griffin / Admit Date: 12/12/2018 / Date of Encounter: 12/13/2018, 8:43 AM   Subjective: Patient sleeping well overnight.  No evidence of chest pain shortness of breath abdominal pain or other significant cardiovascular symptoms.  Troponin level peaked at 97 consistent with demand ischemia.  No evidence of hypoxia.  Patient has continued telemetry changes with atrial flutter with controlled ventricular rate but no evidence of advanced heart block.  Patient tolerating anticoagulation well  Review of Systems: Positive for: Shortness of breath Negative for: Vision change, hearing change, syncope, dizziness, nausea, vomiting,diarrhea, bloody stool, stomach pain, cough, congestion, diaphoresis, urinary frequency, urinary pain,skin lesions, skin rashes Others previously listed  Objective: Telemetry: Atrial flutter with controlled ventricular rate Physical Exam: Blood pressure (!) 139/51, pulse (!) 46, temperature 98.3 F (36.8 C), temperature source Oral, resp. rate 19, height 6' (1.829 m), weight 75.9 kg, SpO2 93 %. Body mass index is 22.7 kg/m. General: Well developed, well nourished, in no acute distress. Head: Normocephalic, atraumatic, sclera non-icteric, no xanthomas, nares are without discharge. Neck: No apparent masses Lungs: Normal respirations with no wheezes, no rhonchi, no rales , no crackles   Heart: Irregular rate and rhythm, normal S1 S2, no murmur, no rub, no gallop, PMI is normal size and placement, carotid upstroke normal without bruit, jugular venous pressure normal Abdomen: Soft, non-tender, non-distended with normoactive bowel sounds. No hepatosplenomegaly. Abdominal aorta is normal size without bruit Extremities: Trace edema, no clubbing, no cyanosis, no ulcers,  Peripheral: 2+ radial, 2+ femoral, 2+ dorsal pedal pulses Neuro: Alert and oriented. Moves all extremities spontaneously. Psych:  Responds to  questions appropriately with a normal affect.   Intake/Output Summary (Last 24 hours) at 12/13/2018 0843 Last data filed at 12/13/2018 0700 Gross per 24 hour  Intake 505.16 ml  Output 400 ml  Net 105.16 ml    Inpatient Medications:  . aspirin EC  81 mg Oral QHS  . buPROPion  150 mg Oral BID  . Carbidopa-Levodopa ER  0.5 tablet Oral TID  . enoxaparin (LOVENOX) injection  40 mg Subcutaneous Q24H  . feeding supplement (GLUCERNA SHAKE)  237 mL Oral TID BM  . finasteride  5 mg Oral Daily  . furosemide  40 mg Oral Daily  . insulin aspart  0-5 Units Subcutaneous QHS  . insulin aspart  0-9 Units Subcutaneous TID WC  . isosorbide mononitrate  30 mg Oral Daily  . lisinopril  40 mg Oral Daily  . loratadine  10 mg Oral Daily  . pantoprazole  40 mg Oral Daily  . pneumococcal 23 valent vaccine  0.5 mL Intramuscular Tomorrow-1000  . polyethylene glycol  17 g Oral Daily  . rOPINIRole  1 mg Oral QHS  . senna-docusate  2 tablet Oral BID  . simvastatin  20 mg Oral Daily  . sodium chloride flush  10 mL Intravenous Q12H  . tamsulosin  0.4 mg Oral Daily   Infusions:   Labs: Recent Labs    12/12/18 0930  NA 142  K 3.8  CL 106  CO2 25  GLUCOSE 108*  BUN 19  CREATININE 0.93  CALCIUM 9.1   No results for input(s): AST, ALT, ALKPHOS, BILITOT, PROT, ALBUMIN in the last 72 hours. Recent Labs    12/12/18 0930 12/13/18 0524  WBC 7.9 9.4  HGB 14.6 12.7*  HCT 46.1 38.8*  MCV 86.2 84.3  PLT 193 196   No results for input(s): CKTOTAL, CKMB, TROPONINI in the last 72  hours. Invalid input(s): POCBNP Recent Labs    12/12/18 1623  HGBA1C 6.3*     Weights: Filed Weights   12/12/18 8250 12/13/18 0157  Weight: 81.6 kg 75.9 kg     Radiology/Studies:  Dg Chest Port 1 View  Result Date: 12/12/2018 CLINICAL DATA:  Chest pain EXAM: PORTABLE CHEST 1 VIEW COMPARISON:  09/06/2018 FINDINGS: Cardiac shadow is enlarged but stable. Aortic calcifications are again seen. Increased vascular  congestion is noted with mild interstitial edema. Previously seen left basilar pneumonia has cleared. IMPRESSION: Persistent vascular congestion with interstitial edema. No focal infiltrate is noted. Electronically Signed   By: Inez Catalina M.D.   On: 12/12/2018 09:48     Assessment and Recommendation  78 y.o. male with known coronary artery disease essential hypertension mixed hyperlipidemia and paroxysmal nonvalvular atrial fibrillation now with atrial flutter with controlled ventricular rate with left anterior fascicular block by EKG and minimal elevation of troponin consistent with demand ischemia without evidence of myocardial infarction or acute coronary syndrome with some vascular congestion by x-ray improved clinically 1.  Continue Lasix for vascular congestion and pulmonary edema with hypoxia 2.  No further cardiac diagnostics or intervention at this time due to no evidence of acute coronary syndrome 3.  Begin ambulation and follow for heart rate control and significance of concerns of atrial flutter but do not add any beta-blocker or heart regulating medications at this time.  If ambulating well okay for discharged home 4.  Anticoagulation for further risk reduction in stroke with atrial fibrillation and atrial flutter 5.  Follow-up in clinic next week for further evaluation of adjustments of medication management or or further intervention as necessary  Signed, Serafina Royals M.D. FACC

## 2018-12-13 NOTE — Progress Notes (Addendum)
Discharge instructions gone over with wife Vaughan Basta over the phone and with patient. Prescriptions in discharge packet and faxed to [place]. IV's taken out and tele monitor off. EMS called for transport. Patient will be going home via EMS.    Update: Patient transported home via EMS.

## 2018-12-14 LAB — ECHOCARDIOGRAM COMPLETE
Height: 72 in
Weight: 2678.4 oz

## 2019-10-17 ENCOUNTER — Emergency Department
Admission: EM | Admit: 2019-10-17 | Discharge: 2019-10-17 | Disposition: A | Discharge status: Home / Self Care | Payer: Medicare Other | Attending: Emergency Medicine | Admitting: Emergency Medicine

## 2019-10-17 ENCOUNTER — Emergency Department: Payer: Medicare Other

## 2019-10-17 ENCOUNTER — Other Ambulatory Visit: Payer: Self-pay

## 2019-10-17 DIAGNOSIS — E119 Type 2 diabetes mellitus without complications: Secondary | ICD-10-CM | POA: Diagnosis not present

## 2019-10-17 DIAGNOSIS — I442 Atrioventricular block, complete: Secondary | ICD-10-CM | POA: Diagnosis not present

## 2019-10-17 DIAGNOSIS — R001 Bradycardia, unspecified: Secondary | ICD-10-CM | POA: Diagnosis present

## 2019-10-17 DIAGNOSIS — I119 Hypertensive heart disease without heart failure: Secondary | ICD-10-CM | POA: Diagnosis not present

## 2019-10-17 DIAGNOSIS — G20 Parkinson's disease: Secondary | ICD-10-CM | POA: Diagnosis not present

## 2019-10-17 DIAGNOSIS — I2510 Atherosclerotic heart disease of native coronary artery without angina pectoris: Secondary | ICD-10-CM | POA: Diagnosis not present

## 2019-10-17 DIAGNOSIS — I459 Conduction disorder, unspecified: Secondary | ICD-10-CM

## 2019-10-17 LAB — CBC
HCT: 34.6 % — ABNORMAL LOW (ref 39.0–52.0)
Hemoglobin: 11.7 g/dL — ABNORMAL LOW (ref 13.0–17.0)
MCH: 29.1 pg (ref 26.0–34.0)
MCHC: 33.8 g/dL (ref 30.0–36.0)
MCV: 86.1 fL (ref 80.0–100.0)
Platelets: 208 10*3/uL (ref 150–400)
RBC: 4.02 MIL/uL — ABNORMAL LOW (ref 4.22–5.81)
RDW: 15.9 % — ABNORMAL HIGH (ref 11.5–15.5)
WBC: 7.1 10*3/uL (ref 4.0–10.5)
nRBC: 0 % (ref 0.0–0.2)

## 2019-10-17 LAB — BASIC METABOLIC PANEL
Anion gap: 6 (ref 5–15)
BUN: 25 mg/dL — ABNORMAL HIGH (ref 8–23)
CO2: 24 mmol/L (ref 22–32)
Calcium: 9.1 mg/dL (ref 8.9–10.3)
Chloride: 107 mmol/L (ref 98–111)
Creatinine, Ser: 1.25 mg/dL — ABNORMAL HIGH (ref 0.61–1.24)
GFR calc Af Amer: >60 mL/min (ref >60)
GFR calc non Af Amer: 56 mL/min — ABNORMAL LOW (ref >60)
Glucose, Bld: 107 mg/dL — ABNORMAL HIGH (ref 70–99)
Potassium: 4.4 mmol/L (ref 3.5–5.1)
Sodium: 137 mmol/L (ref 135–145)

## 2019-10-17 LAB — TROPONIN I (HIGH SENSITIVITY): Troponin I (High Sensitivity): 53 ng/L — ABNORMAL HIGH (ref <18)

## 2019-10-17 NOTE — ED Triage Notes (Addendum)
Pt here via [facility] from home.   Pt was working with therapist today when she noticed his HR was in the 40's. Pt is asymptomatic at this time, denies chest pain, but does have swelling and numbness present in bilateral lower extremities.  EMS EKG shows RBBB, VS- bp 144/58, O2 RA 94%, HR 40.  Hx Parkinson's. Pt uses electric wheelchair.

## 2019-10-17 NOTE — ED Notes (Signed)
Pt unable to sign due to inability to use signature pad. Pt discharged with caregiver.

## 2019-10-17 NOTE — ED Provider Notes (Signed)
ER Provider Note       Time seen: 11:32 AM    I have reviewed the vital signs and the nursing notes.  HISTORY   Chief Complaint Bradycardia    HPI Gregory Griffin is a 79 y.o. male with a history of second-degree AV block, coronary artery disease, diabetes, GERD, hyperlipidemia, hypertension, sleep apnea who presents today for slow heart rate at home.  Patient was working with a therapist today when she noticed his heart rate was in the 40s.  He is asymptomatic, was receiving therapy for Parkinson's disease.  He denies any recent illness or other complaints.  Past Medical History:  Diagnosis Date  . AV block, 2nd degree   . Benign paroxysmal positional vertigo   . BPH (benign prostatic hyperplasia)   . CAD (coronary artery disease)   . Chronic bronchitis (HCC)   . Diabetes mellitus, type II (HCC)   . GERD (gastroesophageal reflux disease)   . HLD (hyperlipidemia)   . HTN (hypertension)   . OSA (obstructive sleep apnea)   . SOB (shortness of breath)   . Urinary retention   . Valvular heart disease     Past Surgical History:  Procedure Laterality Date  . BRAIN SURGERY  2009  . CATARACT EXTRACTION Right 2014   Left 2015  . COLECTOMY  06/2011  . REPAIR DURAL / CSF LEAK  2009  . ROTATOR CUFF REPAIR Left    x 2  . TONSILLECTOMY      Allergies Percocet [oxycodone-acetaminophen]  Review of Systems Constitutional: Negative for fever. Cardiovascular: Negative for chest pain. Respiratory: Negative for shortness of breath. Gastrointestinal: Negative for abdominal pain, vomiting and diarrhea. Musculoskeletal: Negative for back pain. Skin: Negative for rash. Neurological: Positive for generalized weakness  All systems negative/normal/unremarkable except as stated in the HPI  ____________________________________________   PHYSICAL EXAM:  VITAL SIGNS: Vitals:   10/17/19 1129  BP: (!) 162/58  Pulse: (!) 51  Resp: 17  Temp: 98 F (36.7 C)  SpO2: 98%     Constitutional: Alert and oriented.  No acute distress Eyes: Conjunctivae are normal. Normal extraocular movements. Cardiovascular: Slow rate, regular rhythm. No murmurs, rubs, or gallops. Respiratory: Normal respiratory effort without tachypnea nor retractions. Breath sounds are clear and equal bilaterally. No wheezes/rales/rhonchi. Gastrointestinal: Soft and nontender. Normal bowel sounds Musculoskeletal: Nontender with normal range of motion in extremities.  Peripheral edema is noted Neurologic:  Normal speech and language. No gross focal neurologic deficits are appreciated.  Skin:  Skin is warm, dry and intact. No rash noted. Psychiatric: Speech and behavior are normal.  ____________________________________________  EKG: Interpreted by me.  Complete AV block with wide QRS, right bundle branch block, left anterior fascicular block, normal QT  ____________________________________________   LABS (pertinent positives/negatives)  Labs Reviewed  BASIC METABOLIC PANEL - Abnormal; Notable for the following components:      Result Value   Glucose, Bld 107 (*)    BUN 25 (*)    Creatinine, Ser 1.25 (*)    GFR calc non Af Amer 56 (*)    All other components within normal limits  CBC - Abnormal; Notable for the following components:   RBC 4.02 (*)    Hemoglobin 11.7 (*)    HCT 34.6 (*)    RDW 15.9 (*)    All other components within normal limits  TROPONIN I (HIGH SENSITIVITY) - Abnormal; Notable for the following components:   Troponin I (High Sensitivity) 53 (*)    All other components  within normal limits    RADIOLOGY  Images were viewed by me Chest x-ray IMPRESSION: Cardiomegaly with vascular congestion and diffuse interstitial lung opacities suggesting edema.  DIFFERENTIAL DIAGNOSIS  Arrhythmia, sick sinus syndrome, electrolyte abnormality, medication side effect, MI, Parkinson's disease  ASSESSMENT AND PLAN  Heart block   Plan: The patient had presented for slow  heart rate. Patient's labs did not reveal any acute process.  Patient states actually he feels fine, has been doing home physical therapy and has almost completed that.  He has no symptoms at this time.  I discussed with cardiology who will arrange for a Holter monitor for him.  His ventricular rhythm appears to be compensating well.  Daryel November MD    Note: This note was generated in part or whole with voice recognition software. Voice recognition is usually quite accurate but there are transcription errors that can and very often do occur. I apologize for any typographical errors that were not detected and corrected.     Emily Filbert, MD 10/17/19 [phone]

## 2019-10-17 NOTE — ED Notes (Signed)
Pt states caregiver is on the way to help him into the wheelchair and out to the car. Pt is unable to stand on his own and his caregiver lifts him.

## 2020-01-27 ENCOUNTER — Emergency Department: Payer: No Typology Code available for payment source

## 2020-01-27 ENCOUNTER — Encounter: Payer: Self-pay | Admitting: Emergency Medicine

## 2020-01-27 ENCOUNTER — Observation Stay
Admission: EM | Admit: 2020-01-27 | Discharge: 2020-01-28 | Disposition: A | Discharge status: Home / Self Care | Payer: No Typology Code available for payment source | Attending: Internal Medicine | Admitting: Internal Medicine

## 2020-01-27 ENCOUNTER — Other Ambulatory Visit: Payer: Self-pay

## 2020-01-27 DIAGNOSIS — Z79899 Other long term (current) drug therapy: Secondary | ICD-10-CM | POA: Diagnosis not present

## 2020-01-27 DIAGNOSIS — I10 Essential (primary) hypertension: Secondary | ICD-10-CM | POA: Diagnosis not present

## 2020-01-27 DIAGNOSIS — Z7982 Long term (current) use of aspirin: Secondary | ICD-10-CM | POA: Insufficient documentation

## 2020-01-27 DIAGNOSIS — R1030 Lower abdominal pain, unspecified: Secondary | ICD-10-CM | POA: Diagnosis not present

## 2020-01-27 DIAGNOSIS — I480 Paroxysmal atrial fibrillation: Secondary | ICD-10-CM | POA: Diagnosis not present

## 2020-01-27 DIAGNOSIS — R531 Weakness: Principal | ICD-10-CM | POA: Insufficient documentation

## 2020-01-27 DIAGNOSIS — Z87891 Personal history of nicotine dependence: Secondary | ICD-10-CM | POA: Insufficient documentation

## 2020-01-27 DIAGNOSIS — E119 Type 2 diabetes mellitus without complications: Secondary | ICD-10-CM | POA: Diagnosis not present

## 2020-01-27 DIAGNOSIS — R109 Unspecified abdominal pain: Secondary | ICD-10-CM

## 2020-01-27 DIAGNOSIS — Z20822 Contact with and (suspected) exposure to covid-19: Secondary | ICD-10-CM | POA: Insufficient documentation

## 2020-01-27 DIAGNOSIS — G20 Parkinson's disease: Secondary | ICD-10-CM | POA: Diagnosis not present

## 2020-01-27 DIAGNOSIS — I441 Atrioventricular block, second degree: Secondary | ICD-10-CM | POA: Diagnosis not present

## 2020-01-27 DIAGNOSIS — Z7901 Long term (current) use of anticoagulants: Secondary | ICD-10-CM | POA: Insufficient documentation

## 2020-01-27 DIAGNOSIS — I459 Conduction disorder, unspecified: Secondary | ICD-10-CM | POA: Diagnosis not present

## 2020-01-27 LAB — COMPREHENSIVE METABOLIC PANEL
ALT: 37 U/L (ref 0–44)
AST: 37 U/L (ref 15–41)
Albumin: 2.9 g/dL — ABNORMAL LOW (ref 3.5–5.0)
Alkaline Phosphatase: 216 U/L — ABNORMAL HIGH (ref 38–126)
Anion gap: 11 (ref 5–15)
BUN: 20 mg/dL (ref 8–23)
CO2: 20 mmol/L — ABNORMAL LOW (ref 22–32)
Calcium: 9 mg/dL (ref 8.9–10.3)
Chloride: 105 mmol/L (ref 98–111)
Creatinine, Ser: 1.19 mg/dL (ref 0.61–1.24)
GFR, Estimated: >60 mL/min (ref >60)
Glucose, Bld: 99 mg/dL (ref 70–99)
Potassium: 4 mmol/L (ref 3.5–5.1)
Sodium: 136 mmol/L (ref 135–145)
Total Bilirubin: 1.1 mg/dL (ref 0.3–1.2)
Total Protein: 8 g/dL (ref 6.5–8.1)

## 2020-01-27 LAB — URINALYSIS, COMPLETE (UACMP) WITH MICROSCOPIC
Bacteria, UA: NONE SEEN
Bilirubin Urine: NEGATIVE
Glucose, UA: NEGATIVE mg/dL
Hgb urine dipstick: NEGATIVE
Ketones, ur: NEGATIVE mg/dL
Leukocytes,Ua: NEGATIVE
Nitrite: NEGATIVE
Protein, ur: 100 mg/dL — AB
Specific Gravity, Urine: 1.039 — ABNORMAL HIGH (ref 1.005–1.030)
pH: 5 (ref 5.0–8.0)

## 2020-01-27 LAB — CBC WITH DIFFERENTIAL/PLATELET
Abs Immature Granulocytes: 0.03 10*3/uL (ref 0.00–0.07)
Basophils Absolute: 0 10*3/uL (ref 0.0–0.1)
Basophils Relative: 0 %
Eosinophils Absolute: 0.2 10*3/uL (ref 0.0–0.5)
Eosinophils Relative: 2 %
HCT: 39.8 % (ref 39.0–52.0)
Hemoglobin: 12.2 g/dL — ABNORMAL LOW (ref 13.0–17.0)
Immature Granulocytes: 0 %
Lymphocytes Relative: 11 %
Lymphs Abs: 1 10*3/uL (ref 0.7–4.0)
MCH: 27.4 pg (ref 26.0–34.0)
MCHC: 30.7 g/dL (ref 30.0–36.0)
MCV: 89.2 fL (ref 80.0–100.0)
Monocytes Absolute: 1.3 10*3/uL — ABNORMAL HIGH (ref 0.1–1.0)
Monocytes Relative: 14 %
Neutro Abs: 6.7 10*3/uL (ref 1.7–7.7)
Neutrophils Relative %: 73 %
Platelets: 335 10*3/uL (ref 150–400)
RBC: 4.46 MIL/uL (ref 4.22–5.81)
RDW: 17.4 % — ABNORMAL HIGH (ref 11.5–15.5)
WBC: 9.3 10*3/uL (ref 4.0–10.5)
nRBC: 0 % (ref 0.0–0.2)

## 2020-01-27 LAB — MAGNESIUM: Magnesium: 1.9 mg/dL (ref 1.7–2.4)

## 2020-01-27 LAB — TROPONIN I (HIGH SENSITIVITY)
Troponin I (High Sensitivity): 62 ng/L — ABNORMAL HIGH (ref <18)
Troponin I (High Sensitivity): 60 ng/L — ABNORMAL HIGH (ref <18)

## 2020-01-27 LAB — LACTIC ACID, PLASMA
Lactic Acid, Venous: 1.7 mmol/L (ref 0.5–1.9)
Lactic Acid, Venous: 1.2 mmol/L (ref 0.5–1.9)

## 2020-01-27 LAB — RESPIRATORY PANEL BY RT PCR (FLU A&B, COVID)
Influenza A by PCR: NEGATIVE
Influenza B by PCR: NEGATIVE
SARS Coronavirus 2 by RT PCR: NEGATIVE

## 2020-01-27 LAB — GLUCOSE, CAPILLARY: Glucose-Capillary: 102 mg/dL — ABNORMAL HIGH (ref 70–99)

## 2020-01-27 MED ORDER — IOHEXOL 300 MG/ML  SOLN
100.0000 mL | Freq: Once | INTRAMUSCULAR | Status: AC | PRN
  Administered 2020-01-27: 100 mL via INTRAVENOUS

## 2020-01-27 MED ORDER — CARBIDOPA-LEVODOPA 25-100 MG PO TABS
1.0000 | ORAL_TABLET | Freq: Three times a day (TID) | ORAL | Status: DC
  Administered 2020-01-27 – 2020-01-28 (×3): 1 via ORAL
  Filled 2020-01-27 (×4): qty 1

## 2020-01-27 MED ORDER — BUPROPION HCL ER (SR) 150 MG PO TB12
150.0000 mg | ORAL_TABLET | Freq: Two times a day (BID) | ORAL | Status: DC
  Administered 2020-01-27 – 2020-01-28 (×2): 150 mg via ORAL
  Filled 2020-01-27 (×3): qty 1

## 2020-01-27 MED ORDER — ONDANSETRON HCL 4 MG PO TABS: 4.0000 mg | ORAL_TABLET | Freq: Four times a day (QID) | ORAL | Status: DC | PRN

## 2020-01-27 MED ORDER — INSULIN ASPART 100 UNIT/ML ~~LOC~~ SOLN: 0.0000 [IU] | Freq: Three times a day (TID) | SUBCUTANEOUS | Status: DC

## 2020-01-27 MED ORDER — APIXABAN 5 MG PO TABS
5.0000 mg | ORAL_TABLET | Freq: Two times a day (BID) | ORAL | Status: DC
  Administered 2020-01-27 – 2020-01-28 (×2): 5 mg via ORAL
  Filled 2020-01-27 (×2): qty 1

## 2020-01-27 MED ORDER — FINASTERIDE 5 MG PO TABS
5.0000 mg | ORAL_TABLET | Freq: Every day | ORAL | Status: DC
  Administered 2020-01-28: 5 mg via ORAL
  Filled 2020-01-27: qty 1

## 2020-01-27 MED ORDER — TAMSULOSIN HCL 0.4 MG PO CAPS
0.4000 mg | ORAL_CAPSULE | Freq: Every day | ORAL | Status: DC
  Administered 2020-01-28: 0.4 mg via ORAL
  Filled 2020-01-27: qty 1

## 2020-01-27 MED ORDER — LORATADINE 10 MG PO TABS
10.0000 mg | ORAL_TABLET | Freq: Every day | ORAL | Status: DC
  Administered 2020-01-28: 10 mg via ORAL
  Filled 2020-01-27: qty 1

## 2020-01-27 MED ORDER — KETOROLAC TROMETHAMINE 15 MG/ML IJ SOLN: 15.0000 mg | Freq: Two times a day (BID) | INTRAMUSCULAR | Status: DC | PRN

## 2020-01-27 MED ORDER — IOHEXOL 9 MG/ML PO SOLN
500.0000 mL | Freq: Once | ORAL | Status: AC
  Administered 2020-01-27: 500 mL via ORAL

## 2020-01-27 MED ORDER — MORPHINE SULFATE (PF) 2 MG/ML IV SOLN
2.0000 mg | Freq: Once | INTRAVENOUS | Status: AC
  Administered 2020-01-27: 2 mg via INTRAVENOUS
  Filled 2020-01-27: qty 1

## 2020-01-27 MED ORDER — ONDANSETRON HCL 4 MG/2ML IJ SOLN: 4.0000 mg | Freq: Four times a day (QID) | INTRAMUSCULAR | Status: DC | PRN

## 2020-01-27 MED ORDER — SIMVASTATIN 10 MG PO TABS
20.0000 mg | ORAL_TABLET | Freq: Every day | ORAL | Status: DC
  Administered 2020-01-27: 20 mg via ORAL
  Filled 2020-01-27: qty 2

## 2020-01-27 MED ORDER — IPRATROPIUM-ALBUTEROL 0.5-2.5 (3) MG/3ML IN SOLN: 3.0000 mL | Freq: Four times a day (QID) | RESPIRATORY_TRACT | Status: DC | PRN

## 2020-01-27 MED ORDER — ROPINIROLE HCL 1 MG PO TABS
1.0000 mg | ORAL_TABLET | Freq: Every day | ORAL | Status: DC
  Administered 2020-01-27: 1 mg via ORAL
  Filled 2020-01-27: qty 1

## 2020-01-27 MED ORDER — ASPIRIN EC 81 MG PO TBEC
81.0000 mg | DELAYED_RELEASE_TABLET | Freq: Every day | ORAL | Status: DC
  Administered 2020-01-27: 81 mg via ORAL
  Filled 2020-01-27: qty 1

## 2020-01-27 MED ORDER — ROPINIROLE HCL 0.25 MG PO TABS
0.2500 mg | ORAL_TABLET | Freq: Three times a day (TID) | ORAL | Status: DC
  Filled 2020-01-27: qty 1

## 2020-01-27 MED ORDER — ONDANSETRON HCL 4 MG/2ML IJ SOLN
4.0000 mg | Freq: Once | INTRAMUSCULAR | Status: AC
  Administered 2020-01-27: 4 mg via INTRAVENOUS
  Filled 2020-01-27: qty 2

## 2020-01-27 MED ORDER — ACETAMINOPHEN 325 MG PO TABS
650.0000 mg | ORAL_TABLET | Freq: Four times a day (QID) | ORAL | Status: DC | PRN
  Administered 2020-01-28: 650 mg via ORAL
  Filled 2020-01-27 (×2): qty 2

## 2020-01-27 MED ORDER — SODIUM CHLORIDE 0.9 % IV BOLUS
500.0000 mL | Freq: Once | INTRAVENOUS | Status: AC
  Administered 2020-01-27: 500 mL via INTRAVENOUS

## 2020-01-27 MED ORDER — ISOSORBIDE MONONITRATE ER 60 MG PO TB24
30.0000 mg | ORAL_TABLET | Freq: Every day | ORAL | Status: DC
  Administered 2020-01-28: 30 mg via ORAL
  Filled 2020-01-27: qty 1

## 2020-01-27 MED ORDER — LISINOPRIL 10 MG PO TABS
40.0000 mg | ORAL_TABLET | Freq: Every day | ORAL | Status: DC
  Administered 2020-01-28: 40 mg via ORAL
  Filled 2020-01-27: qty 4

## 2020-01-27 MED ORDER — FUROSEMIDE 40 MG PO TABS
20.0000 mg | ORAL_TABLET | Freq: Every day | ORAL | Status: DC
  Administered 2020-01-28: 20 mg via ORAL
  Filled 2020-01-27: qty 1

## 2020-01-27 MED ORDER — ACETAMINOPHEN 650 MG RE SUPP: 650.0000 mg | Freq: Four times a day (QID) | RECTAL | Status: DC | PRN

## 2020-01-27 MED ORDER — CARBIDOPA-LEVODOPA 25-100 MG PO TBDP: 1.0000 | ORAL_TABLET | Freq: Three times a day (TID) | ORAL | Status: DC

## 2020-01-27 MED ORDER — ROPINIROLE HCL 0.25 MG PO TABS
0.2500 mg | ORAL_TABLET | Freq: Three times a day (TID) | ORAL | Status: DC
  Administered 2020-01-28 (×2): 0.25 mg via ORAL
  Filled 2020-01-27 (×3): qty 1

## 2020-01-27 NOTE — ED Notes (Signed)
Pt cleaned of incontinence, brief and underpad changed. Repositioned onto back per pt request. Call bell in reach

## 2020-01-27 NOTE — ED Notes (Signed)
lav and gray tube sent to lab

## 2020-01-27 NOTE — ED Notes (Signed)
Pt placed in clean brief at this time. Placed mepilex dressing on pressure ulcer visualized at lower sacrum.

## 2020-01-27 NOTE — ED Notes (Signed)
Repeat EKG completed

## 2020-01-27 NOTE — ED Provider Notes (Signed)
[facility] Emergency Department Provider Note ____________________________________________   First MD Initiated Contact with Patient 01/27/20 1024     (approximate)  I have reviewed the triage vital signs and the nursing notes.   HISTORY  Chief Complaint Weakness    HPI Gregory Griffin is a 79 y.o. male with PMH as noted below including diabetes, CAD, second-degree AV block, COPD (not on home O2) presents with lower abdominal pain over the last week, persistent course, not associated with vomiting or diarrhea.  The patient denies dysuria or hematuria.  He believes that he is on an antibiotic for UTI but is not sure.  He states that his family member takes care of his medications.  He reports chronic shortness of breath with no acute worsening.  Past Medical History:  Diagnosis Date  . AV block, 2nd degree   . Benign paroxysmal positional vertigo   . BPH (benign prostatic hyperplasia)   . CAD (coronary artery disease)   . Chronic bronchitis (HCC)   . Diabetes mellitus, type II (HCC)   . GERD (gastroesophageal reflux disease)   . HLD (hyperlipidemia)   . HTN (hypertension)   . OSA (obstructive sleep apnea)   . SOB (shortness of breath)   . Urinary retention   . Valvular heart disease     Patient Active Problem List   Diagnosis Date Noted  . Chest pain 12/12/2018  . Benign essential hypertension 12/25/2014  . Elevated PSA 12/22/2014  . BPH with obstruction/lower urinary tract symptoms 12/22/2014  . Health care maintenance 11/20/2014  . Parkinson's disease (HCC) 10/03/2014  . Postoperative CSF leak 10/03/2014  . 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 CAD (coronary artery disease) 10/27/2013  . COPD (chronic obstructive pulmonary disease) (HCC) 10/27/2013  . DM (diabetes mellitus) (HCC) 10/27/2013  . OSA (obstructive sleep apnea)  10/27/2013  . Tremor 10/27/2013  . SOB (shortness of breath) 09/17/2013  . GERD (gastroesophageal reflux disease) 09/13/2013  . Mixed hyperlipidemia 09/13/2013  . Second degree AV block 09/13/2013    Past Surgical History:  Procedure Laterality Date  . BRAIN SURGERY  2009  . CATARACT EXTRACTION Right 2014   Left 2015  . COLECTOMY  06/2011  . REPAIR DURAL / CSF LEAK  2009  . ROTATOR CUFF REPAIR Left    x 2  . TONSILLECTOMY      Prior to Admission medications   Medication Sig Start Date End Date Taking? Authorizing Provider  amLODipine (NORVASC) 10 MG tablet Take 10 mg by mouth daily. 08/30/19  Yes [provider]  apixaban (ELIQUIS) 5 MG TABS tablet Take 1 tablet (5 mg total) by mouth 2 (two) times daily. 12/13/18  Yes Shaune Pollack, MD  aspirin EC 81 MG tablet Take 81 mg by mouth at bedtime.    Yes [provider]  buPROPion (WELLBUTRIN SR) 150 MG 12 hr tablet Take 150 mg by mouth 2 (two) times daily.  08/29/14  Yes [provider]  carbidopa-levodopa (PARCOPA) 25-100 MG disintegrating tablet Take 1 tablet by mouth 3 (three) times daily.    Yes [provider]  finasteride (PROSCAR) 5 MG tablet TAKE 1 TABLET BY MOUTH EVERY DAY 03/21/16  Yes McGowan, Carollee Herter A, PA-C  furosemide (LASIX) 20 MG tablet Take 20 mg by mouth daily.  10/26/15  Yes [provider]  isosorbide mononitrate (IMDUR) 30 MG 24 hr tablet Take 1 tablet (30 mg total)  by mouth daily. 12/14/18  Yes Shaune Pollack, MD  lisinopril (ZESTRIL) 40 MG tablet Take 40 mg by mouth daily.   Yes [provider]  loratadine (CLARITIN) 10 MG tablet Take 10 mg by mouth daily.   Yes [provider]  Magnesium Oxide 500 MG TABS Take 1 tablet by mouth daily.   Yes [provider]  Multiple Vitamins-Minerals (CENTRUM SILVER ADULT 50+ PO) Take by mouth.   Yes [provider]  Multiple Vitamins-Minerals (PRESERVISION AREDS 2) CAPS Take 1 capsule by mouth 2 (two) times a  day.   Yes [provider]  omeprazole (PRILOSEC) 20 MG capsule Take 20 mg by mouth daily.   Yes [provider]  potassium chloride (K-DUR) 10 MEQ tablet TK 1 T PO D PRN WITH FLUID PILL 10/26/15  Yes [provider]  rOPINIRole (REQUIP) 0.25 MG tablet Take 0.25 mg by mouth 3 (three) times daily.   Yes [provider]  rOPINIRole (REQUIP) 1 MG tablet Take 1 mg by mouth at bedtime.   Yes [provider]  sennosides-docusate sodium (SENOKOT-S) 8.6-50 MG tablet Take 2 tablets by mouth 2 (two) times daily.   Yes [provider]  simvastatin (ZOCOR) 20 MG tablet Take 20 mg by mouth daily.   Yes [provider]  tamsulosin (FLOMAX) 0.4 MG CAPS capsule TAKE ONE CAPSULE BY MOUTH DAILY 03/21/16  Yes McGowan, Carollee Herter A, PA-C  ipratropium-albuterol (DUONEB) 0.5-2.5 (3) MG/3ML SOLN Take 3 mLs by nebulization 2 (two) times daily as needed.    [provider]  lactulose (CHRONULAC) 10 GM/15ML solution Take 10 g by mouth daily as needed.     [provider]    Allergies Percocet [oxycodone-acetaminophen]  Family History  Problem Relation Age of Onset  . Stroke Father   . Epilepsy Father   . Heart attack Mother   . Blindness Mother   . Kidney disease Neg Hx   . Prostate cancer Neg Hx   . Kidney cancer Neg Hx   . Bladder Cancer Neg Hx     Social History Social History   Tobacco Use  . Smoking status: Former Games developer  . Smokeless tobacco: Never Used  . Tobacco comment: quit 12 years ago  Substance Use Topics  . Alcohol use: No    Alcohol/week: 0.0 standard drinks  . Drug use: No    Review of Systems  Constitutional: No fever/chills. Eyes: No redness. ENT: No sore throat. Cardiovascular: Denies chest pain. Respiratory: Denies acute shortness of breath. Gastrointestinal: No vomiting or diarrhea. Genitourinary: Negative for dysuria or hematuria.  Musculoskeletal: Negative for back pain. Skin: Negative for  rash. Neurological: Negative for headache.   ____________________________________________   PHYSICAL EXAM:  VITAL SIGNS: ED Triage Vitals  Enc Vitals Group     BP 01/27/20 1023 (!) 154/59     Pulse Rate 01/27/20 1023 82     Resp 01/27/20 1023 (!) 22     Temp 01/27/20 1023 98.5 F (36.9 C)     Temp src --      SpO2 01/27/20 1023 100 %     Weight 01/27/20 1025 185 lb (83.9 kg)     Height 01/27/20 1025 6' (1.829 m)     Head Circumference --      Peak Flow --      Pain Score 01/27/20 1023 5     Pain Loc --      Pain Edu? --      Excl. in GC? --  Constitutional: Alert and oriented.  Somewhat weak appearing but in no acute distress. Eyes: Conjunctivae are normal.  No scleral icterus. Head: Atraumatic. Nose: No congestion/rhinnorhea. Mouth/Throat: Mucous membranes are dry.   Neck: Normal range of motion.  Cardiovascular: Normal rate, irregular rhythm. Grossly normal heart sounds.  Good peripheral circulation. Respiratory: Normal respiratory effort.  No retractions. Lungs CTAB. Gastrointestinal: Soft with mild bilateral lower quadrant tenderness.  No distention.  Genitourinary: No flank tenderness. Musculoskeletal: No lower extremity edema.  Extremities warm and well perfused.  Neurologic:  Normal speech and language. No gross focal neurologic deficits are appreciated.  Skin:  Skin is warm and dry. No rash noted. Psychiatric: Mood and affect are normal. Speech and behavior are normal.  ____________________________________________   LABS (all labs ordered are listed, but only abnormal results are displayed)  Labs Reviewed  COMPREHENSIVE METABOLIC PANEL - Abnormal; Notable for the following components:      Result Value   CO2 20 (*)    Albumin 2.9 (*)    Alkaline Phosphatase 216 (*)    All other components within normal limits  CBC WITH DIFFERENTIAL/PLATELET - Abnormal; Notable for the following components:   Hemoglobin 12.2 (*)    RDW 17.4 (*)    Monocytes  Absolute 1.3 (*)    All other components within normal limits  TROPONIN I (HIGH SENSITIVITY) - Abnormal; Notable for the following components:   Troponin I (High Sensitivity) 62 (*)    All other components within normal limits  TROPONIN I (HIGH SENSITIVITY) - Abnormal; Notable for the following components:   Troponin I (High Sensitivity) 60 (*)    All other components within normal limits  RESPIRATORY PANEL BY RT PCR (FLU A&B, COVID)  LACTIC ACID, PLASMA  LACTIC ACID, PLASMA  URINALYSIS, COMPLETE (UACMP) WITH MICROSCOPIC   ____________________________________________  EKG  ED ECG REPORT I, Dionne Bucy, the attending physician, personally viewed and interpreted this ECG.  Date: 01/27/2020 EKG Time: 1022 Rate: 68 Rhythm: Likely second-degree AV block, difficult to interpret due to poor EKG baseline QRS Axis: normal Intervals: RBBB, LAFB ST/T Wave abnormalities: normal Narrative Interpretation: Second-degree heart block with no evidence of acute ischemia; EKG difficult to interpret due to poor baseline  ____________________________________________  RADIOLOGY  CT abdomen/pelvis: No acute abnormality  ____________________________________________   PROCEDURES  Procedure(s) performed: No  Procedures  Critical Care performed: No ____________________________________________   INITIAL IMPRESSION / ASSESSMENT AND PLAN / ED COURSE  Pertinent labs & imaging results that were available during my care of the patient were reviewed by me and considered in my medical decision making (see chart for details).  79 year old male with PMH as noted above presents primarily with bilateral lower abdominal pain over the last week.  Per EMS, the patient is being treated for a UTI.  He denies pain elsewhere.  He reports chronic shortness of breath with no acute worsening.  I reviewed the past medical records in Epic and Care Everywhere.  I do not see any recent visits in which the  patient was given antibiotics. He was most recently treated and released in the ED in July with bradycardia.  He was most recent admitted last year with chest pain and elevated troponin.  On exam currently, the patient is somewhat weak and frail appearing but in no acute distress.  His vital signs are normal except for hypertension.  He has mild bilateral lower quadrant abdominal tenderness.  Mucous membranes appear dry.  The exam is otherwise as described above.  Differential includes  UTI/cystitis, colitis, diverticulitis, volvulus, SBO, gastroenteritis, gastritis, or muscular pain.  I have a low suspicion for any vascular etiology.  We will obtain lab work-up, CT abdomen, and reassess.  ----------------------------------------- 3:51 PM on 01/27/2020 -----------------------------------------  Troponin is elevated but not significantly higher than he has had in the past, and did not rise when repeated.  Lab work-up is otherwise unremarkable.  Urinalysis is still pending.  CT abdomen shows no acute findings.  Based on repeated EKGs and rhythm strips, I was able to get a more clear baseline and actually appears that the patient could be in a third degree heart block although with a normal ventricular rate.  He is not near syncopal or specifically symptomatic related to this.  I consulted Dr. Lady GaryFath from cardiology who is currently evaluating the patient.  I discussed the case with hospitalist for admission.  ____________________________________________   FINAL CLINICAL IMPRESSION(S) / ED DIAGNOSES  Final diagnoses:  Weakness  Heart block      NEW MEDICATIONS STARTED DURING THIS VISIT:  New Prescriptions   No medications on file     Note:  This document was prepared using Dragon voice recognition software and may include unintentional dictation errors.    Dionne BucySiadecki, Teige Rountree, MD 01/27/20 [phone]

## 2020-01-27 NOTE — ED Notes (Signed)
After talking with admit team/hospitalist, providers are to call granddaughter with update tonight.

## 2020-01-27 NOTE — ED Notes (Signed)
Pt noted to have BP cuff off. Pt did not want it on. After talking with RN, pt agreed to have BP cuff on and the frequency of BP being taken changed from every 30 minutes to every 60 minutes. Pt is okay with BP taken every 60 minutes. Lights turned off for comfort.

## 2020-01-27 NOTE — ED Notes (Signed)
PA student at bedside at this time 

## 2020-01-27 NOTE — ED Notes (Signed)
Pt to CT at this time.

## 2020-01-27 NOTE — ED Notes (Signed)
CT called and informed that pt finished contrast at this time

## 2020-01-27 NOTE — ED Notes (Signed)
Granddaughter Tammy updated on pt condition at this time

## 2020-01-27 NOTE — ED Notes (Signed)
Sacral pad on the buttock of the pt. Pt turned on his right side

## 2020-01-27 NOTE — H&P (Signed)
History and Physical    PLEASE NOTE THAT DRAGON DICTATION SOFTWARE WAS USED IN THE CONSTRUCTION OF THIS NOTE.   Gregory Griffin [id number] DOB: 08/04/1940 DOA: 01/27/2020  [facility]   I have personally briefly reviewed [facility]  Chief Complaint: Abdominal pain  HPI: Gregory Criglerhurman L Date is a 79 y.o. male with medical history significant for paroxysmal atrial fibrillation/atrial flutter chronically anticoagulated on Eliquis, moderate mitral regurgitation, second-degree AV block Mobitz type I, chronically elevated troponin, hypertension, type 2 diabetes mellitus managed via lifestyle modifications, Parkinson's disease, who is admitted to [facility] on 01/27/2020 with generalized weakness after presenting from home to [facility] complaining of lower abdominal pain.   The patient reports 4 to 5 days of crampy abdominal discomfort in the bilateral lower abdominal quadrants.  He denies any radiation from this location, and reports worsening of the discomfort with movement of his upper torso as well as with palpation over the abdomen.  He also notes exacerbation of this discomfort with forced cough. denies any exacerbation in a postprandial timeframe.  Pain is unchanged with bowel movement.  Denies any associated nausea, vomiting, hematemesis, diarrhea, melena, hematochezia.  Denies any recent trauma.  Reports that he is never previously experienced this type of abdominal discomfort.   Denies any associated subjective fever, chills, rigors, or general myalgias.  Denies any recent headache, neck stiffness, rhinitis, rhinorrhea, sore throat, shortness of breath, cough, or rash.  No recent traveling or known COVID-19 exposures.  Denies any associated dysuria, gross hematuria, or change in urinary urgency/frequency.  He notes generalized weakness over the last 3 to  4 days in the absence of any acute focal weakness or change in sensation.  In the context of Parkinson's disease, the patient reports that he has 24-hour caregivers at home.  He notes that he has been relying more so on his 24-hour care givers for completion of his ADLs over the last days in the context of aforementioned generalized weakness.  Patient's cardiac history is notable for the following: Is a documented history of paroxysmal atrial fibrillation/atrial flutter and is chronically anticoagulated on Eliquis.  He also has a history of second-degree AV block Mobitz type I.  He is not currently on any AV nodal blocking agents at home.  He denies any recent chest pain, shortness of breath, palpitations, diaphoresis, presyncope, or syncope.  Follows with Dr. Gwen PoundsKowalski as his outpatient cardiologist.     ED Course:  Vital signs in the ED were notable for the following: Temperature max 98.5; heart rate 53-82; blood pressure 144/58 154/59; respiratory rate 17-20, and oxygen saturation 99 to 100% on room air.  Labs were notable for the following: CMP notable for the following: Sodium 136, potassium 4.0, bicarbonate 20 creatinine 1.19 compared to most recent prior value of 1.25 on 10/17/2019.  CBC notable for white blood cell count of 9300.  Lactic acid 1.7.  High-sensitivity troponin I initially found to be 62, with repeat value trending down to 60, which is relative to value of 97 on 12/12/2018.  Urinalysis was notable for 0-5 white blood cells, no bacteria, no squamous epithelial cells, leukocyte esterase negative, nitrite negative.  Screening nasopharyngeal COVID-19 PCR as well as influenza PCR was checked in the ED this evening, and both were found to be negative.  In order to further evaluate presenting bilateral lower abdominal discomfort, CT abdomen/pelvis was obtained and showed no evidence of  acute intra-abdominal process, including no evidence of bowel perforation, abscess, or obstruction.  CT  abdomen and noted an enlarged prostate with median lobe hypertrophy impressing on the base of the bladder, and also showed cholelithiasis without evidence of acute cholecystitis or choledocholithiasis.   EKG showed potential high degree AV block with right bundle branch block and left anterior fascicular block, ventricular rate 52, QTc 441 MS, and no evidence of ST or T wave changes.  In the context concern for progression of known AV block to a high degree AV block, the patient's case was discussed with the on-call cardiologist, Dr. Lady Gary, who will formally consult on this patient and provide additional recommendations.  Preliminarily, cardiology recommendations include overnight admission to the hospitalist service for continued monitoring of the patient's AV block.  At this time, no need for urgent pacemaker or transcutaneous pacing, as the patient is normotensive and asymptomatic, aside from mild generalized weakness, including no evidence of chest pain, presyncope, or syncope.   While in the ED, the following were administered: Morphine 2 mg IV x1.  Normal saline x500 cc bolus.    Review of Systems: As per HPI otherwise 10 point review of systems negative.   Past Medical History:  Diagnosis Date  . AV block, 2nd degree   . Benign paroxysmal positional vertigo   . BPH (benign prostatic hyperplasia)   . CAD (coronary artery disease)   . Chronic bronchitis (HCC)   . Diabetes mellitus, type II (HCC)   . GERD (gastroesophageal reflux disease)   . HLD (hyperlipidemia)   . HTN (hypertension)   . OSA (obstructive sleep apnea)   . SOB (shortness of breath)   . Urinary retention   . Valvular heart disease     Past Surgical History:  Procedure Laterality Date  . BRAIN SURGERY  2009  . CATARACT EXTRACTION Right 2014   Left 2015  . COLECTOMY  06/2011  . REPAIR DURAL / CSF LEAK  2009  . ROTATOR CUFF REPAIR Left    x 2  . TONSILLECTOMY      Social History:  reports that he has quit  smoking. He has never used smokeless tobacco. He reports that he does not drink alcohol and does not use drugs.   Allergies  Allergen Reactions  . Percocet [Oxycodone-Acetaminophen] Nausea And Vomiting    Family History  Problem Relation Age of Onset  . Stroke Father   . Epilepsy Father   . Heart attack Mother   . Blindness Mother   . Kidney disease Neg Hx   . Prostate cancer Neg Hx   . Kidney cancer Neg Hx   . Bladder Cancer Neg Hx     Prior to Admission medications   Medication Sig Start Date End Date Taking? Authorizing Provider  amLODipine (NORVASC) 10 MG tablet Take 10 mg by mouth daily. 08/30/19  Yes [provider]  apixaban (ELIQUIS) 5 MG TABS tablet Take 1 tablet (5 mg total) by mouth 2 (two) times daily. 12/13/18  Yes Shaune Pollack, MD  aspirin EC 81 MG tablet Take 81 mg by mouth at bedtime.    Yes [provider]  buPROPion (WELLBUTRIN SR) 150 MG 12 hr tablet Take 150 mg by mouth 2 (two) times daily.  08/29/14  Yes [provider]  carbidopa-levodopa (PARCOPA) 25-100 MG disintegrating tablet Take 1 tablet by mouth 3 (three) times daily.    Yes [provider]  finasteride (PROSCAR) 5 MG tablet TAKE 1 TABLET BY MOUTH EVERY DAY  03/21/16  Yes McGowan, Carollee Herter A, PA-C  furosemide (LASIX) 20 MG tablet Take 20 mg by mouth daily.  10/26/15  Yes [provider]  isosorbide mononitrate (IMDUR) 30 MG 24 hr tablet Take 1 tablet (30 mg total) by mouth daily. 12/14/18  Yes Shaune Pollack, MD  lisinopril (ZESTRIL) 40 MG tablet Take 40 mg by mouth daily.   Yes [provider]  loratadine (CLARITIN) 10 MG tablet Take 10 mg by mouth daily.   Yes [provider]  Magnesium Oxide 500 MG TABS Take 1 tablet by mouth daily.   Yes [provider]  Multiple Vitamins-Minerals (CENTRUM SILVER ADULT 50+ PO) Take by mouth.   Yes [provider]  Multiple Vitamins-Minerals (PRESERVISION AREDS 2) CAPS Take 1 capsule by mouth 2 (two)  times a day.   Yes [provider]  omeprazole (PRILOSEC) 20 MG capsule Take 20 mg by mouth daily.   Yes [provider]  potassium chloride (K-DUR) 10 MEQ tablet TK 1 T PO D PRN WITH FLUID PILL 10/26/15  Yes [provider]  rOPINIRole (REQUIP) 0.25 MG tablet Take 0.25 mg by mouth 3 (three) times daily.   Yes [provider]  rOPINIRole (REQUIP) 1 MG tablet Take 1 mg by mouth at bedtime.   Yes [provider]  sennosides-docusate sodium (SENOKOT-S) 8.6-50 MG tablet Take 2 tablets by mouth 2 (two) times daily.   Yes [provider]  simvastatin (ZOCOR) 20 MG tablet Take 20 mg by mouth daily.   Yes [provider]  tamsulosin (FLOMAX) 0.4 MG CAPS capsule TAKE ONE CAPSULE BY MOUTH DAILY 03/21/16  Yes McGowan, Carollee Herter A, PA-C  ipratropium-albuterol (DUONEB) 0.5-2.5 (3) MG/3ML SOLN Take 3 mLs by nebulization 2 (two) times daily as needed.    [provider]  lactulose (CHRONULAC) 10 GM/15ML solution Take 10 g by mouth daily as needed.     [provider]     Objective    Physical Exam: Vitals:   01/27/20 1237 01/27/20 1330 01/27/20 1400 01/27/20 1530  BP: (!) 144/59 (!) 144/58 (!) 148/61 (!) 144/56  Pulse: (!) 55 (!) 53 66 (!) 59  Resp: 14 18 20 20   Temp:      SpO2: 99% 99% 100% 98%  Weight:      Height:        General: appears to be stated age; alert, oriented Skin: warm, dry, no rash Head:  AT/[place] Eyes:  PEARL b/l, EOMI Mouth:  Oral mucosa membranes appear moist, normal dentition Neck: supple; trachea midline Heart:  RRR; 2/6 holosystolic murmur noted Lungs: CTAB, did not appreciate any wheezes, rales, or rhonchi Abdomen: + BS; soft, ND, NT Vascular: 2+ pedal pulses b/l; 2+ radial pulses b/l Extremities: no peripheral edema, no muscle wasting   Labs on Admission: I have personally reviewed following labs and imaging studies  CBC: Recent Labs  Lab 01/27/20 1046  WBC 9.3  NEUTROABS 6.7  HGB  12.2*  HCT 39.8  MCV 89.2  PLT 335   Basic Metabolic Panel: Recent Labs  Lab 01/27/20 1046  NA 136  K 4.0  CL 105  CO2 20*  GLUCOSE 99  BUN 20  CREATININE 1.19  CALCIUM 9.0   GFR: Estimated Creatinine Clearance: 58 mL/min (by C-G formula based on SCr of 1.19 mg/dL). Liver Function Tests: Recent Labs  Lab 01/27/20 1046  AST 37  ALT 37  ALKPHOS 216*  BILITOT 1.1  PROT 8.0  ALBUMIN 2.9*   No results  for input(s): LIPASE, AMYLASE in the last 168 hours. No results for input(s): AMMONIA in the last 168 hours. Coagulation Profile: No results for input(s): INR, PROTIME in the last 168 hours. Cardiac Enzymes: No results for input(s): CKTOTAL, CKMB, CKMBINDEX, TROPONINI in the last 168 hours. BNP (last 3 results) No results for input(s): PROBNP in the last 8760 hours. HbA1C: No results for input(s): HGBA1C in the last 72 hours. CBG: No results for input(s): GLUCAP in the last 168 hours. Lipid Profile: No results for input(s): CHOL, HDL, LDLCALC, TRIG, CHOLHDL, LDLDIRECT in the last 72 hours. Thyroid Function Tests: No results for input(s): TSH, T4TOTAL, FREET4, T3FREE, THYROIDAB in the last 72 hours. Anemia Panel: No results for input(s): VITAMINB12, FOLATE, FERRITIN, TIBC, IRON, RETICCTPCT in the last 72 hours. Urine analysis:    Component Value Date/Time   COLORURINE YELLOW (A) 01/27/2020 1528   APPEARANCEUR CLEAR (A) 01/27/2020 1528   APPEARANCEUR Clear 05/06/2014 1310   LABSPEC 1.039 (H) 01/27/2020 1528   LABSPEC 1.015 05/06/2014 1310   PHURINE 5.0 01/27/2020 1528   GLUCOSEU NEGATIVE 01/27/2020 1528   GLUCOSEU Negative 05/06/2014 1310   HGBUR NEGATIVE 01/27/2020 1528   BILIRUBINUR NEGATIVE 01/27/2020 1528   BILIRUBINUR Negative 05/06/2014 1310   KETONESUR NEGATIVE 01/27/2020 1528   PROTEINUR 100 (A) 01/27/2020 1528   NITRITE NEGATIVE 01/27/2020 1528   LEUKOCYTESUR NEGATIVE 01/27/2020 1528   LEUKOCYTESUR Negative 05/06/2014 1310    Radiological Exams on  Admission: CT ABDOMEN PELVIS W CONTRAST  Result Date: 01/27/2020 CLINICAL DATA:  Pelvic pain for 3 days. EXAM: CT ABDOMEN AND PELVIS WITH CONTRAST TECHNIQUE: Multidetector CT imaging of the abdomen and pelvis was performed using the standard protocol following bolus administration of intravenous contrast. CONTRAST:  OMNIPAQUE IOHEXOL 300 MG/ML  SOLN COMPARISON:  None. FINDINGS: Lower chest: The lung bases are clear of an acute process. No pleural effusion or pulmonary lesions. The heart is borderline enlarged. Aortic and coronary artery calcifications are noted. The distal esophagus is grossly normal. Hepatobiliary: No hepatic lesions are identified. No intrahepatic biliary dilatation. Layering gallstones are noted in the gallbladder but no findings for acute cholecystitis. No common bile duct dilatation. Pancreas: Mass, inflammation or ductal dilatation. Spleen: Normal size.  No cul lesions. Adrenals/Urinary Tract: Adrenal glands and kidneys are unremarkable. No worrisome renal lesions or hydronephrosis. Simple appearing right renal cysts are noted. The bladder is unremarkable. No bladder mass or bladder calculi. Stomach/Bowel: The stomach, duodenum, small bowel and colon are unremarkable. No acute inflammatory changes, mass lesions or obstructive findings. The terminal ileum is normal. The appendix is normal. Vascular/Lymphatic: Advanced atherosclerotic calcifications involving the aorta and iliac arteries. No focal aneurysm or dissection. The major venous structures are patent. No mesenteric or retroperitoneal mass or adenopathy. Reproductive: Enlarged prostate gland with median lobe hypertrophy impressing on the base the bladder. The seminal vesicles are grossly normal. Other: No pelvic mass or adenopathy. No free pelvic fluid collections. No inguinal mass or adenopathy. No abdominal wall hernia or subcutaneous lesions. Musculoskeletal: No significant bony findings. Degenerative changes involving the  lumbar spine noted. No fracture or bone lesion. IMPRESSION: 1. No acute abdominal/pelvic findings, mass lesions or adenopathy. 2. Enlarged prostate gland with median lobe hypertrophy impressing on the base the bladder. 3. Cholelithiasis without CT findings for acute cholecystitis. 4. Advanced atherosclerotic calcifications involving the aorta and iliac arteries. 5. Aortic atherosclerosis. Aortic Atherosclerosis (ICD10-I70.0). Electronically Signed   By: Rudie Meyer M.D.   On: 01/27/2020 14:23     EKG: Independently  reviewed, with result as described above.    Assessment/Plan   IBROHIM SIMMERS is a 79 y.o. male with medical history significant for paroxysmal atrial fibrillation/atrial flutter chronically anticoagulated on Eliquis, moderate mitral regurgitation, second-degree AV block Mobitz type I, chronically elevated troponin, hypertension, type 2 diabetes mellitus managed via lifestyle modifications, Parkinson's disease, who is admitted to [facility] on 01/27/2020 with generalized weakness after presenting from home to [facility] complaining of lower abdominal pain.    Principal Problem:   Generalized weakness Active Problems:   Benign essential hypertension   DM (diabetes mellitus) (HCC)   Parkinson's disease (HCC)   Second degree AV block   Abdominal pain    #) Generalized weakness: The patient reports 4 to 5 days of generalized weakness in the absence of any acute focal neurologic deficits that have prompted him to require additional reliance upon his 24 hour/day home caregivers in order for completion of his ADLs.  Source of the patient's generalized weakness is currently not completely clear, although potential progression of AV nodal block may be playing a contributory role.  Evidence of underlying infectious process at this time, including urinalysis which is not suggestive of an underlying UTI, CT abdomen/pelvis showing no  evidence of acute intra-abdominal process, including no evidence of acute cholecystitis.  Additionally, screening Covid PCR and influenza PCR were found to be negative in the ED today.  Presentation does not meet any SIRS criteria and the patient does not appear septic at this time.  Plan: Check TSH.  Repeat CMP and CBC in the morning.  For to place a physical therapy consult to her in the morning, however, the patient politely used this offer.  Recommend management of AV block, as further described below.  Check hemoglobin A1c.  Check VBG to evaluate for any contribution from hypercapnic lethargy.  Check ionized calcium.     #) Abdominal pain: 4 to 5 days of crampy abdominal pain in the bilateral lower abdominal quadrants, with physical exam revealing no evidence of peritoneal signs.  Additionally CT abdomen/pelvis showed evidence of acute intra-abdominal process, including no evidence of underlying etiology.  Given the patient's report of exacerbation of this discomfort with movement of the upper torso, palpation over the abdomen, with forced cough, there may be a musculoskeletal element contributing to his discomfort, such as a abdominal muscular strain.  Consider the possibility of contributory constipation, however the patient reports regular bowel movements, bowel function or frequency.   Plan: Repeat CMP in the morning to evaluate if any intra-abdominal process will declare itself with interval laboratory results.  Check lipase.  Check CBC in the morning.  As needed Tylenol.  As needed Toradol.     #) Chronic troponin elevation: Per chart review it appears that the patient's troponin I is chronically elevated, with range noted to be between 50-97.  Presenting troponin found to be within this range, with initial troponin I this evening found to be 62, with repeat value trending down to 60.  Suspect that this is on the basis of type II supply demand mismatch in the setting of a documented history  of paroxysmal atrial fibrillation/flutter as opposed to representing an acute plaque rupture resulting in a type I ischemic process.  The patient denies any recent chest pain, and presenting EKG does not show any overt evidence of acute ischemic changes, including no evidence of ST elevation.  Plan: Monitor on telemetry overnight.       #) AV block:  In the context of a documented history of second-degree AV block Mobitz type, there was some concern upon presenting EKG/telemetry that there may have been progression to a high degree AV block.  Dr. Lady Gary of cardiology has been consulted for assistance with further evaluation and management of this finding, with associated recommendations currently pending aside from preliminary recommendations to admit to the hospital service for overnight observation.  Per Dr. America Brown preliminary review, there does not appear to any need for urgent pacemaker or transcutaneous pacing, as the patient remains normotensive and asymptomatic.  Of note, the patient does not appear to be on any AV nodal blocking agents at home.  Plan: Monitor on telemetry.  Cardiology consult, with ensuing recommendations currently pending.  Check serum magnesium level check ionized calcium.  Check TSH.      #) Paroxysmal atrial fibrillation/atrial flutter: Documented history of such. In the setting of a CHA2DS2-VASc score of 4, there is an indication for the patient to be on chronic anticoagulation for thromboembolic prophylaxis. Consistent with this, the patient is chronically anticoagulated on Eliquis.  Not on any AV nodal blocking agents at home, particularly in the context history of AV block, as further described above.  Plan: monitor strict I's & O's and daily weights. Repeat BMP in the morning. Check serum magnesium level.  Continue home Eliquis.     #) Essential hypertension: Outpatient antihypertensive regimen includes Norvasc, Imdur, lisinopril, Lasix, as well as  antihypertensive implications from tamsulosin.  Presenting systolic blood pressures noted to be mildly hypertensive.  Plan: Resume home antihypertensive regimen.  We will also resume tamsulosin may provide an element of reflex tachycardic, which could be beneficial in the setting of patient's AV nodal block.  Close monitoring of ensuing blood pressure via routine vital signs.      #) Type 2 diabetes mellitus, the patient reports that his diabetes is managed via lifestyle modifications and that he is not currently on any oral hypoglycemic agents nor any exogenous insulin.  In the setting of presenting generalized weakness, will check hemoglobin A1c.  We will also perform Accu-Cheks to evaluate for any evidence of hypoglycemia that may be can attributing to his generalized weakness.  Plan: Check hemoglobin A1c.  Accu-Cheks before every meal and at bedtime in the absence of any associated sliding scale insulin, as above.      #) Parkinson's disease: On levodopa/carbidopa as well as ropinirole.  Plan: Continue home levodopa/carbidopa and ropinirole.      DVT prophylaxis: Continue home Eliquis. Code Status: Per my discussions with the patient today, he wishes to be DNR/DNI. Family Communication: none Disposition Plan: Per Rounding Team Consults called: Dr. Lady Gary of cardiology consulted, as further described above Admission status: Observation; stepdown.    PLEASE NOTE THAT DRAGON DICTATION SOFTWARE WAS USED IN THE CONSTRUCTION OF THIS NOTE.   Angie Fava DO [facility] Pager [phone] From 12PM- 12AM  Otherwise, please contact night-coverage  [email or web address] Password [facility]  01/27/2020, 5:21 PM

## 2020-01-27 NOTE — ED Triage Notes (Addendum)
Pt arrived via [facility] from home. Per EMS, pt c/o sharp, generalized pelvic pain for the last 3 days. Pt denies pain with urination at this time.   Per EMS, pt started tx for UTI on Wednesday. Per pt, "I have a bump on my bottom that hurts"

## 2020-01-27 NOTE — ED Notes (Signed)
Pharmacy to verify medications

## 2020-01-27 NOTE — Consult Note (Addendum)
Cardiology Consultation Note    Patient ID: Gregory Griffin, MRN: [id number], DOB/AGE: Jan 16, 1941 79 y.o. Admit date: 01/27/2020   Date of Consult: 01/27/2020 Primary Physician: [facility] Primary Cardiologist: Dr. Gwen Pounds  Chief Complaint: lower abdominal pain Reason for Consultation: heart block Requesting MD: Dr. Marisa Severin  HPI: Gregory Griffin is a 79 y.o. male with history of atrial fib/flutter with ventricular rates in the 50-60 raange for a number of years, history of cad/dm,parkinsons disease,  copd, osa, dm, htn who presented to the er with complaints of lower abdominal pain. Noted to have atrial fluttter/fib with ventricular response in the mid 50's to lower 60's. HS troponin unremarkable. Pt denies any syncope he states he is imobile for the past 6 months due to leg stiffness. Denies syncope or chest pain. Denies any new meds. EKGs in the past in the hospital and office have shown similar findings. He is hemodynamically stable at present. He is currently on apixiban, norvasc, asa, carbidop/levodopa, lasix, imdur, lisinopril, simvastatin and tamsulosin.   Past Medical History:  Diagnosis Date  . AV block, 2nd degree   . Benign paroxysmal positional vertigo   . BPH (benign prostatic hyperplasia)   . CAD (coronary artery disease)   . Chronic bronchitis (HCC)   . Diabetes mellitus, type II (HCC)   . GERD (gastroesophageal reflux disease)   . HLD (hyperlipidemia)   . HTN (hypertension)   . OSA (obstructive sleep apnea)   . SOB (shortness of breath)   . Urinary retention   . Valvular heart disease       Surgical History:  Past Surgical History:  Procedure Laterality Date  . BRAIN SURGERY  2009  . CATARACT EXTRACTION Right 2014   Left 2015  . COLECTOMY  06/2011  . REPAIR DURAL / CSF LEAK  2009  . ROTATOR CUFF REPAIR Left    x 2  . TONSILLECTOMY       Home Meds: Prior to Admission medications   Medication Sig Start Date End Date Taking?  Authorizing Provider  amLODipine (NORVASC) 10 MG tablet Take 10 mg by mouth daily. 08/30/19  Yes [provider]  apixaban (ELIQUIS) 5 MG TABS tablet Take 1 tablet (5 mg total) by mouth 2 (two) times daily. 12/13/18  Yes Shaune Pollack, MD  aspirin EC 81 MG tablet Take 81 mg by mouth at bedtime.    Yes [provider]  buPROPion (WELLBUTRIN SR) 150 MG 12 hr tablet Take 150 mg by mouth 2 (two) times daily.  08/29/14  Yes [provider]  carbidopa-levodopa (PARCOPA) 25-100 MG disintegrating tablet Take 1 tablet by mouth 3 (three) times daily.    Yes [provider]  finasteride (PROSCAR) 5 MG tablet TAKE 1 TABLET BY MOUTH EVERY DAY 03/21/16  Yes McGowan, Carollee Herter A, PA-C  furosemide (LASIX) 20 MG tablet Take 20 mg by mouth daily.  10/26/15  Yes [provider]  isosorbide mononitrate (IMDUR) 30 MG 24 hr tablet Take 1 tablet (30 mg total) by mouth daily. 12/14/18  Yes Shaune Pollack, MD  lisinopril (ZESTRIL) 40 MG tablet Take 40 mg by mouth daily.   Yes [provider]  loratadine (CLARITIN) 10 MG tablet Take 10 mg by mouth daily.   Yes [provider]  Magnesium Oxide 500 MG TABS Take 1 tablet by mouth daily.   Yes [provider]  Multiple Vitamins-Minerals (CENTRUM SILVER ADULT 50+ PO) Take by mouth.   Yes [provider]  Multiple Vitamins-Minerals (  PRESERVISION AREDS 2) CAPS Take 1 capsule by mouth 2 (two) times a day.   Yes [provider]  omeprazole (PRILOSEC) 20 MG capsule Take 20 mg by mouth daily.   Yes [provider]  potassium chloride (K-DUR) 10 MEQ tablet TK 1 T PO D PRN WITH FLUID PILL 10/26/15  Yes [provider]  rOPINIRole (REQUIP) 0.25 MG tablet Take 0.25 mg by mouth 3 (three) times daily.   Yes [provider]  rOPINIRole (REQUIP) 1 MG tablet Take 1 mg by mouth at bedtime.   Yes [provider]  sennosides-docusate sodium (SENOKOT-S) 8.6-50 MG tablet Take 2 tablets by  mouth 2 (two) times daily.   Yes [provider]  simvastatin (ZOCOR) 20 MG tablet Take 20 mg by mouth daily.   Yes [provider]  tamsulosin (FLOMAX) 0.4 MG CAPS capsule TAKE ONE CAPSULE BY MOUTH DAILY 03/21/16  Yes McGowan, Carollee Herter A, PA-C  ipratropium-albuterol (DUONEB) 0.5-2.5 (3) MG/3ML SOLN Take 3 mLs by nebulization 2 (two) times daily as needed.    [provider]  lactulose (CHRONULAC) 10 GM/15ML solution Take 10 g by mouth daily as needed.     [provider]    Inpatient Medications:     Allergies:  Allergies  Allergen Reactions  . Percocet [Oxycodone-Acetaminophen] Nausea And Vomiting    Social History   Socioeconomic History  . Marital status: Married    Spouse name: Not on file  . Number of children: Not on file  . Years of education: Not on file  . Highest education level: Not on file  Occupational History  . Not on file  Tobacco Use  . Smoking status: Former Games developer  . Smokeless tobacco: Never Used  . Tobacco comment: quit 12 years ago  Substance and Sexual Activity  . Alcohol use: No    Alcohol/week: 0.0 standard drinks  . Drug use: No  . Sexual activity: Not on file  Other Topics Concern  . Not on file  Social History Narrative  . Not on file   Social Determinants of Health   Financial Resource Strain:   . Difficulty of Paying Living Expenses: Not on file  Food Insecurity:   . Worried About Programme researcher, broadcasting/film/video in the Last Year: Not on file  . Ran Out of Food in the Last Year: Not on file  Transportation Needs:   . Lack of Transportation (Medical): Not on file  . Lack of Transportation (Non-Medical): Not on file  Physical Activity:   . Days of Exercise per Week: Not on file  . Minutes of Exercise per Session: Not on file  Stress:   . Feeling of Stress : Not on file  Social Connections:   . Frequency of Communication with Friends and Family: Not on file  . Frequency of Social Gatherings with Friends and  Family: Not on file  . Attends Religious Services: Not on file  . Active Member of Clubs or Organizations: Not on file  . Attends Banker Meetings: Not on file  . Marital Status: Not on file  Intimate Partner Violence:   . Fear of Current or Ex-Partner: Not on file  . Emotionally Abused: Not on file  . Physically Abused: Not on file  . Sexually Abused: Not on file     Family History  Problem Relation Age of Onset  . Stroke Father   . Epilepsy Father   . Heart attack Mother   . Blindness Mother   .  Kidney disease Neg Hx   . Prostate cancer Neg Hx   . Kidney cancer Neg Hx   . Bladder Cancer Neg Hx      Review of Systems: A 12-system review of systems was performed and is negative except as noted in the HPI.  Labs: No results for input(s): CKTOTAL, CKMB, TROPONINI in the last 72 hours. Lab Results  Component Value Date   WBC 9.3 01/27/2020   HGB 12.2 (L) 01/27/2020   HCT 39.8 01/27/2020   MCV 89.2 01/27/2020   PLT 335 01/27/2020    Recent Labs  Lab 01/27/20 1046  NA 136  K 4.0  CL 105  CO2 20*  BUN 20  CREATININE 1.19  CALCIUM 9.0  PROT 8.0  BILITOT 1.1  ALKPHOS 216*  ALT 37  AST 37  GLUCOSE 99   No results found for: CHOL, HDL, LDLCALC, TRIG No results found for: DDIMER  Radiology/Studies:  CT ABDOMEN PELVIS W CONTRAST  Result Date: 01/27/2020 CLINICAL DATA:  Pelvic pain for 3 days. EXAM: CT ABDOMEN AND PELVIS WITH CONTRAST TECHNIQUE: Multidetector CT imaging of the abdomen and pelvis was performed using the standard protocol following bolus administration of intravenous contrast. CONTRAST:  OMNIPAQUE IOHEXOL 300 MG/ML  SOLN COMPARISON:  None. FINDINGS: Lower chest: The lung bases are clear of an acute process. No pleural effusion or pulmonary lesions. The heart is borderline enlarged. Aortic and coronary artery calcifications are noted. The distal esophagus is grossly normal. Hepatobiliary: No hepatic lesions are identified. No  intrahepatic biliary dilatation. Layering gallstones are noted in the gallbladder but no findings for acute cholecystitis. No common bile duct dilatation. Pancreas: Mass, inflammation or ductal dilatation. Spleen: Normal size.  No cul lesions. Adrenals/Urinary Tract: Adrenal glands and kidneys are unremarkable. No worrisome renal lesions or hydronephrosis. Simple appearing right renal cysts are noted. The bladder is unremarkable. No bladder mass or bladder calculi. Stomach/Bowel: The stomach, duodenum, small bowel and colon are unremarkable. No acute inflammatory changes, mass lesions or obstructive findings. The terminal ileum is normal. The appendix is normal. Vascular/Lymphatic: Advanced atherosclerotic calcifications involving the aorta and iliac arteries. No focal aneurysm or dissection. The major venous structures are patent. No mesenteric or retroperitoneal mass or adenopathy. Reproductive: Enlarged prostate gland with median lobe hypertrophy impressing on the base the bladder. The seminal vesicles are grossly normal. Other: No pelvic mass or adenopathy. No free pelvic fluid collections. No inguinal mass or adenopathy. No abdominal wall hernia or subcutaneous lesions. Musculoskeletal: No significant bony findings. Degenerative changes involving the lumbar spine noted. No fracture or bone lesion. IMPRESSION: 1. No acute abdominal/pelvic findings, mass lesions or adenopathy. 2. Enlarged prostate gland with median lobe hypertrophy impressing on the base the bladder. 3. Cholelithiasis without CT findings for acute cholecystitis. 4. Advanced atherosclerotic calcifications involving the aorta and iliac arteries. 5. Aortic atherosclerosis. Aortic Atherosclerosis (ICD10-I70.0). Electronically Signed   By: Rudie Meyer M.D.   On: 01/27/2020 14:23    Wt Readings from Last 3 Encounters:  01/27/20 83.9 kg  10/17/19 83.9 kg  12/13/18 75.9 kg    EKG: atrial flutter with 4-5-1 block.   Physical Exam:elderly  caucasian male with abdominal pain.  Blood pressure (!) 144/56, pulse (!) 59, temperature 98.5 F (36.9 C), resp. rate 20, height 6' (1.829 m), weight 83.9 kg, SpO2 98 %. Body mass index is 25.09 kg/m. General: Well developed, well nourished, in no acute distress. Head: Normocephalic, atraumatic, sclera non-icteric, no xanthomas, nares are without discharge.  Neck: Negative  for carotid bruits. JVD not elevated. Lungs: Clear bilaterally to auscultation without wheezes, rales, or rhonchi. Breathing is unlabored. Heart: regular rhythm. Bradycardia Abdomen: Tenderness in supra;ubic and lower abdominal area.  Msk:  Strength and tone appear normal for age. Extremities: No clubbing or cyanosis. No edema.  Distal pedal pulses are 2+ and equal bilaterally. Neuro: Alert and oriented X 3. No facial asymmetry. No focal deficit. Moves all extremities spontaneously.Tremor Psych:  Responds to questions appropriately with a normal affect.     Assessment and Plan  79 yo male with history of parkinsons disease, cad, htn, chronic atrial fib/flutter with ventricular rates in the 50s-60's anticoagulated with eliquis. Presented to the er with lower abdominal pain. Note to have what was felt to be 3rd degree heart block. Comparison of EKGs from the past several years have shown a chronic afib/flutter with ventricular rates in the 50s. EKG does not appear to show any changes from previous ones. Has had holter monitors in the past showing afib/flutter with rates in the 50's. No pauses.   1. Aflutter-ekg review suggest aflutter with 4-5:1 block. No pauses. Does not appear to have changed from his baseline. Etiology of abdominal pain unclear and is being evaluated. Does not appear to require pacemaker either temporary or permenant based on current ekg and prior ekgs and holter monitors. Would contine with eliquis at current dose. Not candidate for pacemaker at present. Has ruled out for an mi with flat minimally elevated  troponin and no chest pain and no ischemic changes on ekg. Will follow during hospital stay if admission required form abdominal pain [place]. Does not appear to require admission from heart rate stand point. [facility] to follow up heart rates.   2. Abdominal pain-etiology unclear. U/A pending.   3. CAD- no evidence of ischemia. Would continue with imdur, asa.   4 Parkinsons disease-continue with carbidopa/levidopa.   Signed, Dalia HeadingKenneth A Tyresa Prindiville MD 01/27/2020, 3:42 PM Pager: [phone]

## 2020-01-27 NOTE — ED Notes (Signed)
Pt assisted with urinal at this time.  

## 2020-01-28 DIAGNOSIS — I484 Atypical atrial flutter: Secondary | ICD-10-CM | POA: Diagnosis not present

## 2020-01-28 DIAGNOSIS — R1030 Lower abdominal pain, unspecified: Secondary | ICD-10-CM | POA: Diagnosis not present

## 2020-01-28 DIAGNOSIS — R531 Weakness: Secondary | ICD-10-CM | POA: Diagnosis not present

## 2020-01-28 DIAGNOSIS — R109 Unspecified abdominal pain: Secondary | ICD-10-CM

## 2020-01-28 LAB — BLOOD GAS, VENOUS
Acid-Base Excess: 0.2 mmol/L (ref 0.0–2.0)
Bicarbonate: 24.6 mmol/L (ref 20.0–28.0)
O2 Saturation: 65.1 %
Patient temperature: 37
pCO2, Ven: 38 mmHg — ABNORMAL LOW (ref 44.0–60.0)
pH, Ven: 7.42 (ref 7.250–7.430)
pO2, Ven: 33 mmHg (ref 32.0–45.0)

## 2020-01-28 LAB — COMPREHENSIVE METABOLIC PANEL
ALT: 6 U/L (ref 0–44)
AST: 27 U/L (ref 15–41)
Albumin: 2.6 g/dL — ABNORMAL LOW (ref 3.5–5.0)
Alkaline Phosphatase: 194 U/L — ABNORMAL HIGH (ref 38–126)
Anion gap: 11 (ref 5–15)
BUN: 23 mg/dL (ref 8–23)
CO2: 21 mmol/L — ABNORMAL LOW (ref 22–32)
Calcium: 8.9 mg/dL (ref 8.9–10.3)
Chloride: 104 mmol/L (ref 98–111)
Creatinine, Ser: 1.42 mg/dL — ABNORMAL HIGH (ref 0.61–1.24)
GFR, Estimated: 51 mL/min — ABNORMAL LOW (ref >60)
Glucose, Bld: 111 mg/dL — ABNORMAL HIGH (ref 70–99)
Potassium: 4.2 mmol/L (ref 3.5–5.1)
Sodium: 136 mmol/L (ref 135–145)
Total Bilirubin: 1.2 mg/dL (ref 0.3–1.2)
Total Protein: 7.4 g/dL (ref 6.5–8.1)

## 2020-01-28 LAB — MAGNESIUM: Magnesium: 2.3 mg/dL (ref 1.7–2.4)

## 2020-01-28 LAB — CBC
HCT: 33 % — ABNORMAL LOW (ref 39.0–52.0)
Hemoglobin: 10.6 g/dL — ABNORMAL LOW (ref 13.0–17.0)
MCH: 27.2 pg (ref 26.0–34.0)
MCHC: 32.1 g/dL (ref 30.0–36.0)
MCV: 84.8 fL (ref 80.0–100.0)
Platelets: 363 10*3/uL (ref 150–400)
RBC: 3.89 MIL/uL — ABNORMAL LOW (ref 4.22–5.81)
RDW: 17.2 % — ABNORMAL HIGH (ref 11.5–15.5)
WBC: 10 10*3/uL (ref 4.0–10.5)
nRBC: 0 % (ref 0.0–0.2)

## 2020-01-28 LAB — GLUCOSE, CAPILLARY: Glucose-Capillary: 89 mg/dL (ref 70–99)

## 2020-01-28 LAB — TSH: TSH: 3.867 u[IU]/mL (ref 0.350–4.500)

## 2020-01-28 LAB — HEMOGLOBIN A1C
Hgb A1c MFr Bld: 6.7 % — ABNORMAL HIGH (ref 4.8–5.6)
Mean Plasma Glucose: 146 mg/dL

## 2020-01-28 MED ORDER — CEPHALEXIN 500 MG PO CAPS: 500.0000 mg | ORAL_CAPSULE | Freq: Two times a day (BID) | ORAL | Status: AC

## 2020-01-28 MED ORDER — TRAMADOL HCL 50 MG PO TABS
25.0000 mg | ORAL_TABLET | Freq: Two times a day (BID) | ORAL | 0 refills | Status: AC | PRN
Start: 2020-01-28 — End: 2021-01-27

## 2020-01-28 MED ORDER — CEPHALEXIN 500 MG PO CAPS
500.0000 mg | ORAL_CAPSULE | Freq: Two times a day (BID) | ORAL | Status: DC
  Administered 2020-01-28: 500 mg via ORAL
  Filled 2020-01-28: qty 1

## 2020-01-28 NOTE — ED Notes (Signed)
Patient lying in bed and appears to be asleep, NAD observed and will continue to monitor 

## 2020-01-28 NOTE — Discharge Summary (Signed)
Physician Discharge Summary  Patient ID: Gregory Griffin MRN: [id number] DOB/AGE: [birth year] 79 y.o.  Admit date: 01/27/2020 Discharge date: 01/28/2020  Admission Diagnoses:  Discharge Diagnoses:  Principal Problem:   Generalized weakness Active Problems:   Benign essential hypertension   DM (diabetes mellitus) (HCC)   Parkinson's disease (HCC)   Second degree AV block   Abdominal pain Atrial flutter with 4:1 conduction. Chronic kidney disease stage IIIa.  Discharged Condition: good  Hospital Course:  Gregory Griffin is a 79 y.o. male with medical history significant for paroxysmal atrial fibrillation/atrial flutter chronically anticoagulated on Eliquis, moderate mitral regurgitation, second-degree AV block Mobitz type I, chronically elevated troponin, hypertension, type 2 diabetes mellitus managed via lifestyle modifications, Parkinson's disease, who is admitted to [facility] on 01/27/2020 with generalized weakness after presenting from home to [facility] Emergency Department complaining of lower abdominal pain.   Discussed with patient and granddaughter, patient had 3 or 4 days of diarrhea about 10 days ago.  Then diarrhea stopped, patient started have lower abdominal pain on the right side.  He also has been having normal bowel movements for the last for 5 days without nausea vomiting.  By the time he came to the emergency room, he had a CT abdomen/pelvis with contrast, showed a normal appendix.  But significantly enlarged prostate in the middle lobe, compressing on the bladder.  Upon my examination, patient is still complaining of the right lower quadrant pain, the tenderness is in the much lower right quadrant, the condition could be due to an ileitis as patient has been having diarrhea prior to this.  Patient be referred to GI for colonoscopy, last colonoscopy was 10 years ago. Patient also has enlarged prostate compressing on bladder.  He was  recently diagnosed with UTI, was taking Keflex for 7 days, still have 7 days ago.  Urine study when he came in was normal.  He will be referred to urology as outpatient.  There is also concern for possible AV block.  I personally reviewed the patient EKG and several strips, it consistent with a flutter with 4:1 conduction.  Patient has been seen by cardiology, who is also agreeable to this diagnosis.  No need for additional work-up for the heart.  At this point, patient condition is stable for discharge.  He also has chronic kidney disease stage IIIa, baseline creatinine was 1.25.  He has slight elevation of creatinine today, probably from decreased p.o. intake.  He will be followed by his family doctor in the near future.    Consults: Cardiology  Significant Diagnostic Studies: CT ABDOMEN AND PELVIS WITH CONTRAST  TECHNIQUE: Multidetector CT imaging of the abdomen and pelvis was performed using the standard protocol following bolus administration of intravenous contrast.  CONTRAST:  OMNIPAQUE IOHEXOL 300 MG/ML  SOLN  COMPARISON:  None.  FINDINGS: Lower chest: The lung bases are clear of an acute process. No pleural effusion or pulmonary lesions. The heart is borderline enlarged. Aortic and coronary artery calcifications are noted. The distal esophagus is grossly normal.  Hepatobiliary: No hepatic lesions are identified. No intrahepatic biliary dilatation. Layering gallstones are noted in the gallbladder but no findings for acute cholecystitis. No common bile duct dilatation.  Pancreas: Mass, inflammation or ductal dilatation.  Spleen: Normal size.  No cul lesions.  Adrenals/Urinary Tract: Adrenal glands and kidneys are unremarkable. No worrisome renal lesions or hydronephrosis. Simple appearing right renal cysts are noted. The bladder is unremarkable. No bladder mass or bladder calculi.  Stomach/Bowel: The stomach, duodenum, small bowel and colon  are unremarkable. No acute inflammatory changes, mass lesions or obstructive findings. The terminal ileum is normal. The appendix is normal.  Vascular/Lymphatic: Advanced atherosclerotic calcifications involving the aorta and iliac arteries. No focal aneurysm or dissection. The major venous structures are patent.  No mesenteric or retroperitoneal mass or adenopathy.  Reproductive: Enlarged prostate gland with median lobe hypertrophy impressing on the base the bladder. The seminal vesicles are grossly normal.  Other: No pelvic mass or adenopathy. No free pelvic fluid collections. No inguinal mass or adenopathy. No abdominal wall hernia or subcutaneous lesions.  Musculoskeletal: No significant bony findings. Degenerative changes involving the lumbar spine noted. No fracture or bone lesion.  IMPRESSION: 1. No acute abdominal/pelvic findings, mass lesions or adenopathy. 2. Enlarged prostate gland with median lobe hypertrophy impressing on the base the bladder. 3. Cholelithiasis without CT findings for acute cholecystitis. 4. Advanced atherosclerotic calcifications involving the aorta and iliac arteries. 5. Aortic atherosclerosis.  Aortic Atherosclerosis (ICD10-I70.0).   Electronically Signed   By: Rudie Meyer M.D.   On: 01/27/2020 14:23    Treatments: Cardiac monitor  Discharge Exam: Blood pressure (!) 146/66, pulse 64, temperature 98.8 F (37.1 C), resp. rate 18, height 6' (1.829 m), weight 83.9 kg, SpO2 98 %. General appearance: alert and cooperative Resp: clear to auscultation bilaterally Cardio: Irregular, mild bradycardic, no murmurs. GI: Mild tenderness in the right lower quadrant. Extremities: extremities normal, atraumatic, no cyanosis or edema  Disposition: Discharge disposition: 01-Home or Self Care       Discharge Instructions    Diet - low sodium heart healthy   Complete by: As directed    Increase activity slowly   Complete by: As  directed      Allergies as of 01/28/2020      Reactions   Percocet [oxycodone-acetaminophen] Nausea And Vomiting      Medication List    TAKE these medications   amLODipine 10 MG tablet Commonly known as: NORVASC Take 10 mg by mouth daily.   apixaban 5 MG Tabs tablet Commonly known as: ELIQUIS Take 1 tablet (5 mg total) by mouth 2 (two) times daily.   aspirin EC 81 MG tablet Take 81 mg by mouth at bedtime.   buPROPion 150 MG 12 hr tablet Commonly known as: WELLBUTRIN SR Take 150 mg by mouth 2 (two) times daily.   carbidopa-levodopa 25-100 MG disintegrating tablet Commonly known as: PARCOPA Take 1 tablet by mouth 3 (three) times daily.   CENTRUM SILVER ADULT 50+ PO Take by mouth.   PreserVision AREDS 2 Caps Take 1 capsule by mouth 2 (two) times a day.   cephALEXin 500 MG capsule Commonly known as: KEFLEX Take 1 capsule (500 mg total) by mouth every 12 (twelve) hours.   finasteride 5 MG tablet Commonly known as: PROSCAR TAKE 1 TABLET BY MOUTH EVERY DAY   furosemide 20 MG tablet Commonly known as: LASIX Take 20 mg by mouth daily.   ipratropium-albuterol 0.5-2.5 (3) MG/3ML Soln Commonly known as: DUONEB Take 3 mLs by nebulization 2 (two) times daily as needed.   isosorbide mononitrate 30 MG 24 hr tablet Commonly known as: IMDUR Take 1 tablet (30 mg total) by mouth daily.   lactulose 10 GM/15ML solution Commonly known as: CHRONULAC Take 10 g by mouth daily as needed.   lisinopril 40 MG tablet Commonly known as: ZESTRIL Take 40 mg by mouth daily.   loratadine 10 MG tablet Commonly known as: CLARITIN Take 10 mg by  mouth daily.   Magnesium Oxide 500 MG Tabs Take 1 tablet by mouth daily.   omeprazole 20 MG capsule Commonly known as: PRILOSEC Take 20 mg by mouth daily.   potassium chloride 10 MEQ tablet Commonly known as: KLOR-CON TK 1 T PO D PRN WITH FLUID PILL   rOPINIRole 0.25 MG tablet Commonly known as: REQUIP Take 0.25 mg by mouth 3  (three) times daily.   rOPINIRole 1 MG tablet Commonly known as: REQUIP Take 1 mg by mouth at bedtime.   sennosides-docusate sodium 8.6-50 MG tablet Commonly known as: SENOKOT-S Take 2 tablets by mouth 2 (two) times daily.   simvastatin 20 MG tablet Commonly known as: ZOCOR Take 20 mg by mouth daily.   tamsulosin 0.4 MG Caps capsule Commonly known as: FLOMAX TAKE ONE CAPSULE BY MOUTH DAILY       Follow-up Information    Lamar Blinks, MD Follow up in 1 week(s).   Specialty: Cardiology Contact information: [address] [facility] [place] [address] [phone]        [facility] Follow up in 1 week(s).   Contact information: [address] [place] [address] [phone]        Riki Altes, MD Follow up in 2 week(s).   Specialty: Urology Contact information: [address] [place] [address] [phone]        Wyline Mood, MD Follow up in 1 week(s).   Specialty: Gastroenterology Contact information: [address] [place] [address] [phone]               Signed: Marrion Coy 01/28/2020, 12:39 PM

## 2020-01-28 NOTE — ED Notes (Addendum)
Provider notified of pts BP and pulse rate of being in the 40s and 50s

## 2020-01-28 NOTE — ED Notes (Addendum)
Per Patient granddaughter Tammy patient started on Cefelaxin 500mg  BID x 14 days he started Wednesday 20th at nighttime. Last dose was yesterday morning he has missed two doses last night and this morning. Patient has an UTI

## 2020-01-28 NOTE — Progress Notes (Signed)
Patient Name: Gregory Griffin Date of Encounter: 01/28/2020  Hospital Problem List     Principal Problem:   Generalized weakness Active Problems:   Benign essential hypertension   DM (diabetes mellitus) (HCC)   Parkinson's disease (HCC)   Second degree AV block   Abdominal pain    Patient Profile     79 y.o. male with history of atrial fib/flutter with ventricular rates in the 50-60 range for a number of years, history of cad/dm,parkinsons disease,  copd, osa, dm, htn who presented to the er with complaints of lower abdominal pain. Noted to have atrial fluttter/fib with ventricular response in the mid 50's to lower 60's. HS troponin unremarkable. Pt denies any syncope he states he is imobile for the past 6 months due to leg stiffness. Denies syncope or chest pain. Denies any new meds. EKGs in the past in the hospital and office have shown similar findings. He is hemodynamically stable at present. He is currently on apixiban, norvasc, asa, carbidop/levodopa, lasix, imdur, lisinopril, simvastatin and tamsulosin.   Subjective   Still complaining of abdominal pain. No chest pain or dizziness  Inpatient Medications    . apixaban  5 mg Oral BID  . aspirin EC  81 mg Oral QHS  . buPROPion  150 mg Oral BID  . carbidopa-levodopa  1 tablet Oral TID  . finasteride  5 mg Oral Daily  . furosemide  20 mg Oral Daily  . insulin aspart  0-6 Units Subcutaneous TID WC  . isosorbide mononitrate  30 mg Oral Daily  . lisinopril  40 mg Oral Daily  . loratadine  10 mg Oral Daily  . rOPINIRole  0.25 mg Oral TID  . rOPINIRole  1 mg Oral QHS  . simvastatin  20 mg Oral QHS  . tamsulosin  0.4 mg Oral Daily    Vital Signs    Vitals:   01/28/20 0500 01/28/20 0515 01/28/20 0530 01/28/20 0635  BP: (!) 141/52   (!) 146/59  Pulse:  60 65 60  Resp: 17 17 18 16   Temp:      SpO2: 97% 99% 98% 98%  Weight:      Height:        Intake/Output Summary (Last 24 hours) at 01/28/2020 0807 Last data  filed at 01/27/2020 1529 Gross per 24 hour  Intake --  Output 300 ml  Net -300 ml   Filed Weights   01/27/20 1025  Weight: 83.9 kg    Physical Exam    GEN: Well nourished, well developed, in no acute distress.  HEENT: normal.  Neck: Supple, no JVD, carotid bruits, or masses. Cardiac: RRR, no murmurs, rubs, or gallops. No clubbing, cyanosis, edema.  Radials/DP/PT 2+ and equal bilaterally.  Respiratory:  Respirations regular and unlabored, clear to auscultation bilaterally. GI: suprapubic tenderness MS: no deformity or atrophy. Skin: warm and dry, no rash. Neuro:  Strength and sensation are intact. Psych: Normal affect.  Labs    CBC Recent Labs    01/27/20 1046 01/28/20 0532  WBC 9.3 10.0  NEUTROABS 6.7  --   HGB 12.2* 10.6*  HCT 39.8 33.0*  MCV 89.2 84.8  PLT 335 363   Basic Metabolic Panel Recent Labs    01/30/20 1046 01/27/20 1237 01/28/20 0532  NA 136  --  136  K 4.0  --  4.2  CL 105  --  104  CO2 20*  --  21*  GLUCOSE 99  --  111*  BUN 20  --  23  CREATININE 1.19  --  1.42*  CALCIUM 9.0  --  8.9  MG  --  1.9 2.3   Liver Function Tests Recent Labs    01/27/20 1046 01/28/20 0532  AST 37 27  ALT 37 6  ALKPHOS 216* 194*  BILITOT 1.1 1.2  PROT 8.0 7.4  ALBUMIN 2.9* 2.6*   No results for input(s): LIPASE, AMYLASE in the last 72 hours. Cardiac Enzymes No results for input(s): CKTOTAL, CKMB, CKMBINDEX, TROPONINI in the last 72 hours. BNP No results for input(s): BNP in the last 72 hours. D-Dimer No results for input(s): DDIMER in the last 72 hours. Hemoglobin A1C No results for input(s): HGBA1C in the last 72 hours. Fasting Lipid Panel No results for input(s): CHOL, HDL, LDLCALC, TRIG, CHOLHDL, LDLDIRECT in the last 72 hours. Thyroid Function Tests Recent Labs    01/28/20 0532  TSH 3.867    Telemetry    Atrial flutter with ventricular rate of 60  ECG    Atrial flutter with 4-5:1 block  Radiology    CT ABDOMEN PELVIS W  CONTRAST  Result Date: 01/27/2020 CLINICAL DATA:  Pelvic pain for 3 days. EXAM: CT ABDOMEN AND PELVIS WITH CONTRAST TECHNIQUE: Multidetector CT imaging of the abdomen and pelvis was performed using the standard protocol following bolus administration of intravenous contrast. CONTRAST:  OMNIPAQUE IOHEXOL 300 MG/ML  SOLN COMPARISON:  None. FINDINGS: Lower chest: The lung bases are clear of an acute process. No pleural effusion or pulmonary lesions. The heart is borderline enlarged. Aortic and coronary artery calcifications are noted. The distal esophagus is grossly normal. Hepatobiliary: No hepatic lesions are identified. No intrahepatic biliary dilatation. Layering gallstones are noted in the gallbladder but no findings for acute cholecystitis. No common bile duct dilatation. Pancreas: Mass, inflammation or ductal dilatation. Spleen: Normal size.  No cul lesions. Adrenals/Urinary Tract: Adrenal glands and kidneys are unremarkable. No worrisome renal lesions or hydronephrosis. Simple appearing right renal cysts are noted. The bladder is unremarkable. No bladder mass or bladder calculi. Stomach/Bowel: The stomach, duodenum, small bowel and colon are unremarkable. No acute inflammatory changes, mass lesions or obstructive findings. The terminal ileum is normal. The appendix is normal. Vascular/Lymphatic: Advanced atherosclerotic calcifications involving the aorta and iliac arteries. No focal aneurysm or dissection. The major venous structures are patent. No mesenteric or retroperitoneal mass or adenopathy. Reproductive: Enlarged prostate gland with median lobe hypertrophy impressing on the base the bladder. The seminal vesicles are grossly normal. Other: No pelvic mass or adenopathy. No free pelvic fluid collections. No inguinal mass or adenopathy. No abdominal wall hernia or subcutaneous lesions. Musculoskeletal: No significant bony findings. Degenerative changes involving the lumbar spine noted. No fracture  or bone lesion. IMPRESSION: 1. No acute abdominal/pelvic findings, mass lesions or adenopathy. 2. Enlarged prostate gland with median lobe hypertrophy impressing on the base the bladder. 3. Cholelithiasis without CT findings for acute cholecystitis. 4. Advanced atherosclerotic calcifications involving the aorta and iliac arteries. 5. Aortic atherosclerosis. Aortic Atherosclerosis (ICD10-I70.0). Electronically Signed   By: Rudie Meyer M.D.   On: 01/27/2020 14:23    Assessment & Plan    79 yo male with history of parkinsons disease, cad, htn, chronic atrial fib/flutter with ventricular rates in the 50s-60's anticoagulated with eliquis. Presented to the er with lower abdominal pain. Noted to have what was felt to be 3rd degree heart block. Comparison of EKGs from the past several years have shown a chronic afib/flutter with ventricular rates in the 50s. EKG does not  appear to show any changes from previous ones. Has had holter monitors in the past showing afib/flutter with rates in the 50's. No pauses.   1. Aflutter-ekg review suggest aflutter with 4-5:1 block. No pauses. Does not appear to have changed from his baseline. Etiology of abdominal pain unclear and is being evaluated. Does not  require pacemaker either temporary or permenant at present based on current ekg and prior ekgs and holter monitors. Would contine with eliquis at current dose. Has ruled out for an mi with flat minimally elevated troponin and no chest pain and no ischemic changes on ekg. Will follow during hospital stay if admission required form abdominal pain [place]. Will follow in [facility] this week to follow up heart rates.   2. Abdominal pain-etiology unclear. U/A pending.   3. CAD- no evidence of ischemia. Would continue with imdur, asa.   4 Parkinsons disease-continue with carbidopa/levidopa  Signed, Darlin Priestly. Mykael Trott MD 01/28/2020, 8:07 AM  Pager: [phone]

## 2020-01-28 NOTE — ED Notes (Signed)
Patient discharged home with granddaughter Tammy, patient received discharge papers and prescription. Patient appropriate and cooperative. Vital signs taken. NAD noted.

## 2020-01-28 NOTE — ED Notes (Signed)
Patient speaking with Dr. Chipper Herb

## 2020-01-28 NOTE — ED Notes (Signed)
Pt provided with several sips of water

## 2020-01-28 NOTE — ED Notes (Signed)
Pt noted to have urinated in depends. Pt cleaned and changed. Pt repositions. Sacral pad noted to still be intact on posterior buttock.

## 2020-01-28 NOTE — Progress Notes (Signed)
I attempted to contact the patient's grand-daughter Babette Relic Kirckpatrick; [phone]) to discuss treatment plan and answer any questions, as requested by the patient. However, she did not did pick-up and does not currently have voicemail set-up. Will ask that day team reach out to patient's grand-daughter to provide updates and answer any questions that she might have.     Newton Pigg, DO Hospitalist

## 2020-01-28 NOTE — Discharge Instructions (Signed)
1.  Follow-up with PCP in 1 week. 2.  Follow-up with urology in 2 weeks for enlarged prostate. 3.  Follow-up with GI for colonoscopy for possible ileitis.

## 2020-01-29 LAB — CALCIUM, IONIZED: Calcium, Ionized, Serum: 5.1 mg/dL (ref 4.5–5.6)

## 2020-01-31 ENCOUNTER — Emergency Department (HOSPITAL_COMMUNITY)
Admission: EM | Admit: 2020-01-31 | Discharge: 2020-01-31 | Disposition: A | Discharge status: Home / Self Care | Payer: No Typology Code available for payment source | Attending: Emergency Medicine | Admitting: Emergency Medicine

## 2020-01-31 ENCOUNTER — Other Ambulatory Visit: Payer: Self-pay

## 2020-01-31 ENCOUNTER — Encounter (HOSPITAL_COMMUNITY): Payer: Self-pay | Admitting: Emergency Medicine

## 2020-01-31 DIAGNOSIS — E119 Type 2 diabetes mellitus without complications: Secondary | ICD-10-CM | POA: Diagnosis not present

## 2020-01-31 DIAGNOSIS — K5900 Constipation, unspecified: Secondary | ICD-10-CM | POA: Insufficient documentation

## 2020-01-31 DIAGNOSIS — Z7901 Long term (current) use of anticoagulants: Secondary | ICD-10-CM | POA: Diagnosis not present

## 2020-01-31 DIAGNOSIS — R10811 Right upper quadrant abdominal tenderness: Secondary | ICD-10-CM | POA: Diagnosis not present

## 2020-01-31 DIAGNOSIS — R10813 Right lower quadrant abdominal tenderness: Secondary | ICD-10-CM | POA: Insufficient documentation

## 2020-01-31 DIAGNOSIS — R1030 Lower abdominal pain, unspecified: Secondary | ICD-10-CM | POA: Diagnosis present

## 2020-01-31 DIAGNOSIS — Z87891 Personal history of nicotine dependence: Secondary | ICD-10-CM | POA: Insufficient documentation

## 2020-01-31 DIAGNOSIS — K219 Gastro-esophageal reflux disease without esophagitis: Secondary | ICD-10-CM | POA: Insufficient documentation

## 2020-01-31 DIAGNOSIS — Z7982 Long term (current) use of aspirin: Secondary | ICD-10-CM | POA: Diagnosis not present

## 2020-01-31 DIAGNOSIS — I10 Essential (primary) hypertension: Secondary | ICD-10-CM | POA: Diagnosis not present

## 2020-01-31 DIAGNOSIS — Z79899 Other long term (current) drug therapy: Secondary | ICD-10-CM | POA: Diagnosis not present

## 2020-01-31 DIAGNOSIS — R109 Unspecified abdominal pain: Secondary | ICD-10-CM

## 2020-01-31 DIAGNOSIS — Z20822 Contact with and (suspected) exposure to covid-19: Secondary | ICD-10-CM | POA: Insufficient documentation

## 2020-01-31 DIAGNOSIS — R251 Tremor, unspecified: Secondary | ICD-10-CM | POA: Insufficient documentation

## 2020-01-31 DIAGNOSIS — R197 Diarrhea, unspecified: Secondary | ICD-10-CM | POA: Insufficient documentation

## 2020-01-31 DIAGNOSIS — J449 Chronic obstructive pulmonary disease, unspecified: Secondary | ICD-10-CM | POA: Diagnosis not present

## 2020-01-31 DIAGNOSIS — I2510 Atherosclerotic heart disease of native coronary artery without angina pectoris: Secondary | ICD-10-CM | POA: Diagnosis not present

## 2020-01-31 LAB — COMPREHENSIVE METABOLIC PANEL
ALT: 7 U/L (ref 0–44)
AST: 35 U/L (ref 15–41)
Albumin: 2.2 g/dL — ABNORMAL LOW (ref 3.5–5.0)
Alkaline Phosphatase: 211 U/L — ABNORMAL HIGH (ref 38–126)
Anion gap: 11 (ref 5–15)
BUN: 18 mg/dL (ref 8–23)
CO2: 22 mmol/L (ref 22–32)
Calcium: 9 mg/dL (ref 8.9–10.3)
Chloride: 105 mmol/L (ref 98–111)
Creatinine, Ser: 1.16 mg/dL (ref 0.61–1.24)
GFR, Estimated: >60 mL/min (ref >60)
Glucose, Bld: 98 mg/dL (ref 70–99)
Potassium: 4.2 mmol/L (ref 3.5–5.1)
Sodium: 138 mmol/L (ref 135–145)
Total Bilirubin: 1 mg/dL (ref 0.3–1.2)
Total Protein: 7.3 g/dL (ref 6.5–8.1)

## 2020-01-31 LAB — URINALYSIS, ROUTINE W REFLEX MICROSCOPIC
Bacteria, UA: NONE SEEN
Bilirubin Urine: NEGATIVE
Glucose, UA: NEGATIVE mg/dL
Hgb urine dipstick: NEGATIVE
Ketones, ur: NEGATIVE mg/dL
Leukocytes,Ua: NEGATIVE
Nitrite: NEGATIVE
Protein, ur: 30 mg/dL — AB
Specific Gravity, Urine: 1.012 (ref 1.005–1.030)
pH: 5 (ref 5.0–8.0)

## 2020-01-31 LAB — CBC
HCT: 37.1 % — ABNORMAL LOW (ref 39.0–52.0)
Hemoglobin: 11.6 g/dL — ABNORMAL LOW (ref 13.0–17.0)
MCH: 26.9 pg (ref 26.0–34.0)
MCHC: 31.3 g/dL (ref 30.0–36.0)
MCV: 86.1 fL (ref 80.0–100.0)
Platelets: 421 10*3/uL — ABNORMAL HIGH (ref 150–400)
RBC: 4.31 MIL/uL (ref 4.22–5.81)
RDW: 17.2 % — ABNORMAL HIGH (ref 11.5–15.5)
WBC: 12.5 10*3/uL — ABNORMAL HIGH (ref 4.0–10.5)
nRBC: 0 % (ref 0.0–0.2)

## 2020-01-31 LAB — POC OCCULT BLOOD, ED: Fecal Occult Bld: NEGATIVE

## 2020-01-31 LAB — RESPIRATORY PANEL BY RT PCR (FLU A&B, COVID)
Influenza A by PCR: NEGATIVE
Influenza B by PCR: NEGATIVE
SARS Coronavirus 2 by RT PCR: NEGATIVE

## 2020-01-31 LAB — LIPASE, BLOOD: Lipase: 26 U/L (ref 11–51)

## 2020-01-31 MED ORDER — ACETAMINOPHEN 325 MG PO TABS
650.0000 mg | ORAL_TABLET | Freq: Once | ORAL | Status: AC
  Administered 2020-01-31: 650 mg via ORAL
  Filled 2020-01-31: qty 2

## 2020-01-31 NOTE — ED Triage Notes (Signed)
Pt c/o abd pain and diarrhea since been dc from the hospital.

## 2020-01-31 NOTE — ED Notes (Signed)
E-signature pad unavailable at time of pt discharge. This RN discussed discharge materials with pt and answered all pt questions. Pt stated understanding of discharge material. ? ?

## 2020-01-31 NOTE — Progress Notes (Signed)
TOC Team met with Pt and granddaughter, Gregory Griffin, at bedside.  Gregory Griffin states that Pt has 24 hour care.  Gregory Griffin also reports that she is working with the [facility] to find long-term placement for Pt.   Per EDP, Pt does not meet criteria for admission. TOC recommended that Gregory Griffin reach out to [employer] to assist in placement process.

## 2020-01-31 NOTE — ED Provider Notes (Signed)
Transfer of Care Note I assumed care of Gregory Griffin on 01/31/2020  Care of the patient was assumed from Dr. Madaline Guthrie at 3 pm; see this physician's note for complete history of present illness, review of systems, and physical exam.    Briefly, Gregory Griffin is a 79 y.o. male who:  Presented with abdominal pain and diarrhea  Seen for same at OSH this week, CT at OSH negative, dx with UTI put on keflex   Pt reports dark stools, FOB pending   Hx parkinson's disease, non ambulatory at baseline, HLD, OSA, HTN, BPH, T2DM, GERD  The plan includes:  Fu urine    Please refer to the original provider's note for additional information regarding the care of Gregory Griffin.  ### Reassessment: I personally reassessed the patient:  Vital Signs:  The most current vitals were  Vitals:   01/31/20 1144 01/31/20 1337  BP: (!) 136/52 (!) 127/53  Pulse:  (!) 50  Resp:  18  Temp:    SpO2:  100%    Hemodynamics:  The patient is hemodynamically stable. Mental Status:  The patient is Alert  Additional MDM: Urine without evidence of infection.  FOB negative.   Pt with soft non tender abdomen on re-examination.   The patient was stable for discharge.  The patient was hemodynamically stable.  I provided ED return precautions. The patient agreed to followup outpatient as needed.  The care of this patient was supervised by Dr. Rubin Payor, who agreed with the plan and management of the patient.       Brantley Fling, MD 01/31/20 2979    Benjiman Core, MD 02/01/20 Marlyne Beards

## 2020-01-31 NOTE — ED Provider Notes (Signed)
[facility] Provider Note   CSN: [id number] Arrival date & time: 01/31/20  1118     History Chief Complaint  Patient presents with  . Abdominal Pain    Kirby Criglerhurman L Humphres is a 79 y.o. male.  The history is provided by the patient.  Abdominal Pain Pain location:  Suprapubic Pain quality: aching   Pain radiates to:  Does not radiate Pain severity:  Moderate Onset quality:  Gradual Duration:  1 week Timing:  Constant Progression:  Worsening Chronicity:  New Relieved by:  Nothing Worsened by:  Movement Associated symptoms: constipation (chronic constipation), diarrhea (recent intermittent diarrhea that has been dark occasionally) and fatigue   Associated symptoms: no chest pain, no chills, no cough, no dysuria, no fever, no hematuria, no shortness of breath, no sore throat and no vomiting        Past Medical History:  Diagnosis Date  . AV block, 2nd degree   . Benign paroxysmal positional vertigo   . BPH (benign prostatic hyperplasia)   . CAD (coronary artery disease)   . Chronic bronchitis (HCC)   . Diabetes mellitus, type II (HCC)   . GERD (gastroesophageal reflux disease)   . HLD (hyperlipidemia)   . HTN (hypertension)   . OSA (obstructive sleep apnea)   . SOB (shortness of breath)   . Urinary retention   . Valvular heart disease     Patient Active Problem List   Diagnosis Date Noted  . Abdominal pain 01/28/2020  . Generalized weakness 01/27/2020  . Chest pain 12/12/2018  . Benign essential hypertension 12/25/2014  . Elevated PSA 12/22/2014  . BPH with obstruction/lower urinary tract symptoms 12/22/2014  . Health care maintenance 11/20/2014  . Parkinson's disease (HCC) 10/03/2014  . Postoperative CSF leak 10/03/2014  . 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 CAD (coronary artery disease) 10/27/2013  . COPD  (chronic obstructive pulmonary disease) (HCC) 10/27/2013  . DM (diabetes mellitus) (HCC) 10/27/2013  . OSA (obstructive sleep apnea) 10/27/2013  . Tremor 10/27/2013  . SOB (shortness of breath) 09/17/2013  . GERD (gastroesophageal reflux disease) 09/13/2013  . Mixed hyperlipidemia 09/13/2013  . Second degree AV block 09/13/2013    Past Surgical History:  Procedure Laterality Date  . BRAIN SURGERY  2009  . CATARACT EXTRACTION Right 2014   Left 2015  . COLECTOMY  06/2011  . REPAIR DURAL / CSF LEAK  2009  . ROTATOR CUFF REPAIR Left    x 2  . TONSILLECTOMY         Family History  Problem Relation Age of Onset  . Stroke Father   . Epilepsy Father   . Heart attack Mother   . Blindness Mother   . Kidney disease Neg Hx   . Prostate cancer Neg Hx   . Kidney cancer Neg Hx   . Bladder Cancer Neg Hx     Social History   Tobacco Use  . Smoking status: Former Games developermoker  . Smokeless tobacco: Never Used  . Tobacco comment: quit 12 years ago  Substance Use Topics  . Alcohol use: No    Alcohol/week: 0.0 standard drinks  . Drug use: No    Home Medications Prior to Admission medications   Medication Sig Start Date End Date Taking? Authorizing Provider  amLODipine (NORVASC) 10 MG tablet Take 10 mg by mouth daily. 08/30/19   [provider]  apixaban (ELIQUIS) 5 MG TABS  tablet Take 1 tablet (5 mg total) by mouth 2 (two) times daily. 12/13/18   Shaune Pollack, MD  aspirin EC 81 MG tablet Take 81 mg by mouth at bedtime.     [provider]  buPROPion (WELLBUTRIN SR) 150 MG 12 hr tablet Take 150 mg by mouth 2 (two) times daily.  08/29/14   [provider]  carbidopa-levodopa (PARCOPA) 25-100 MG disintegrating tablet Take 1 tablet by mouth 3 (three) times daily.     [provider]  cephALEXin (KEFLEX) 500 MG capsule Take 1 capsule (500 mg total) by mouth every 12 (twelve) hours. 01/28/20   Marrion Coy, MD  finasteride (PROSCAR) 5 MG tablet TAKE 1 TABLET BY  MOUTH EVERY DAY 03/21/16   McGowan, Carollee Herter A, PA-C  furosemide (LASIX) 20 MG tablet Take 20 mg by mouth daily.  10/26/15   [provider]  ipratropium-albuterol (DUONEB) 0.5-2.5 (3) MG/3ML SOLN Take 3 mLs by nebulization 2 (two) times daily as needed.    [provider]  isosorbide mononitrate (IMDUR) 30 MG 24 hr tablet Take 1 tablet (30 mg total) by mouth daily. 12/14/18   Shaune Pollack, MD  lactulose [facility]) 10 GM/15ML solution Take 10 g by mouth daily as needed.     [provider]  lisinopril (ZESTRIL) 40 MG tablet Take 40 mg by mouth daily.    [provider]  loratadine (CLARITIN) 10 MG tablet Take 10 mg by mouth daily.    [provider]  Magnesium Oxide 500 MG TABS Take 1 tablet by mouth daily.    [provider]  Multiple Vitamins-Minerals (CENTRUM SILVER ADULT 50+ PO) Take by mouth.    [provider]  Multiple Vitamins-Minerals (PRESERVISION AREDS 2) CAPS Take 1 capsule by mouth 2 (two) times a day.    [provider]  omeprazole (PRILOSEC) 20 MG capsule Take 20 mg by mouth daily.    [provider]  potassium chloride (K-DUR) 10 MEQ tablet TK 1 T PO D PRN WITH FLUID PILL 10/26/15   [provider]  rOPINIRole (REQUIP) 0.25 MG tablet Take 0.25 mg by mouth 3 (three) times daily.    [provider]  rOPINIRole (REQUIP) 1 MG tablet Take 1 mg by mouth at bedtime.    [provider]  sennosides-docusate sodium (SENOKOT-S) 8.6-50 MG tablet Take 2 tablets by mouth 2 (two) times daily.    [provider]  simvastatin (ZOCOR) 20 MG tablet Take 20 mg by mouth daily.    [provider]  tamsulosin (FLOMAX) 0.4 MG CAPS capsule TAKE ONE CAPSULE BY MOUTH DAILY Patient taking differently: Take 0.4 mg by mouth daily.  03/21/16   Michiel Cowboy A, PA-C  traMADol (ULTRAM) 50 MG tablet Take 0.5 tablets (25 mg total) by mouth every 12 (twelve) hours as needed for severe pain.  01/28/20 01/27/21  Marrion Coy, MD    Allergies    Percocet [oxycodone-acetaminophen]  Review of Systems   Review of Systems  Constitutional: Positive for fatigue. Negative for chills and fever.  HENT: Negative for ear pain and sore throat.   Eyes: Negative for pain and visual disturbance.  Respiratory: Negative for cough and shortness of breath.   Cardiovascular: Negative for chest pain and palpitations.  Gastrointestinal: Positive for abdominal pain, constipation (chronic constipation) and diarrhea (recent intermittent diarrhea that has been dark occasionally). Negative for vomiting.  Genitourinary: Positive for decreased urine volume. Negative for dysuria and hematuria.  Musculoskeletal: Negative for arthralgias and back pain.  Skin: Negative for color change and rash.  Neurological: Negative for seizures and syncope.  All other systems reviewed and are negative.   Physical Exam Updated Vital Signs BP (!) 127/53 (BP Location: Right Arm)   Pulse (!) 50   Temp 98.3 F (36.8 C) (Oral)   Resp 18   SpO2 100%   Physical Exam Vitals and nursing note reviewed. Exam conducted with a chaperone present.  Constitutional:      Appearance: He is ill-appearing (appears chronically ill). He is not toxic-appearing or diaphoretic.  HENT:     Head: Normocephalic and atraumatic.  Eyes:     Conjunctiva/sclera: Conjunctivae normal.  Cardiovascular:     Rate and Rhythm: Normal rate and regular rhythm.     Heart sounds: No murmur heard.   Pulmonary:     Effort: Pulmonary effort is normal. No respiratory distress.     Breath sounds: Normal breath sounds.  Abdominal:     Palpations: Abdomen is soft.     Tenderness: There is abdominal tenderness in the right upper quadrant and right lower quadrant. There is no guarding or rebound.  Genitourinary:    Penis: No erythema, tenderness or swelling.      Testes: Cremasteric reflex is present.        Right: Mass, tenderness or swelling not  present.        Left: Mass, tenderness or swelling not present.     Prostate: Not enlarged and not tender.     Rectum: Guaiac result negative. No mass or tenderness.     Comments: Some small balls of firm stool in rectum Musculoskeletal:     Cervical back: Neck supple.  Lymphadenopathy:     Lower Body: No right inguinal adenopathy. No left inguinal adenopathy.  Skin:    General: Skin is warm and dry.  Neurological:     Mental Status: He is alert.     Motor: Tremor (resting tremor in R hand) present.     ED Results / Procedures / Treatments   Labs (all labs ordered are listed, but only abnormal results are displayed) Labs Reviewed  COMPREHENSIVE METABOLIC PANEL - Abnormal; Notable for the following components:      Result Value   Albumin 2.2 (*)    Alkaline Phosphatase 211 (*)    All other components within normal limits  CBC - Abnormal; Notable for the following components:   WBC 12.5 (*)    Hemoglobin 11.6 (*)    HCT 37.1 (*)    RDW 17.2 (*)    Platelets 421 (*)    All other components within normal limits  URINALYSIS, ROUTINE W REFLEX MICROSCOPIC - Abnormal; Notable for the following components:   Protein, ur 30 (*)    All other components within normal limits  URINE CULTURE  RESPIRATORY PANEL BY RT PCR (FLU A&B, COVID)  LIPASE, BLOOD  POC OCCULT BLOOD, ED    EKG None  Radiology No results found.  Procedures Procedures (including critical care time)  Medications Ordered in ED Medications  acetaminophen (TYLENOL) tablet 650 mg (650 mg Oral Given 01/31/20 1525)    ED Course  I have reviewed the triage vital signs and the nursing notes.  Pertinent labs & imaging results that were available during my care of the patient were reviewed by me and considered in my medical decision making (see chart for details).    MDM Rules/Calculators/A&P  The patient is a 79yo male, PMH parkinsons, nonambulatory at baseline, recent UTI on keflex,  who presents to the ED for ongoing abd pain.  On my initial evaluation, the patient is  hemodynamically stable, afebrile, nontoxic-appearing. Physical exam remarkable for R sided tenderness.  Differentials considered include appendicitis, UTI, renal stones, torsion. Reviewed records from recent brief admission to [facility]. Patient being treated for UTI with keflex, has some firm stool on exam. Suspect pain from resolving UTI and constipation. No fevers or peritonitis on exam, low suspicion for worsening intraabdominal etiology.   Patient provided PO tylenol for pain. Labs remarkable for mild leukocytosis, low albumin. Do not think patient requires repeat CT abdomen pelvis at this time.   Transfer of care to Dr. Nunzio Cory at 1500. Plan of care communicated, which is pending urine studies. Patient in stable condition at time of transfer.   The care of this patient was overseen by Dr. Rubin Payor, who agreed with evaluation and plan of care.   Final Clinical Impression(s) / ED Diagnoses Final diagnoses:  None    Rx / DC Orders ED Discharge Orders    None       Loletha Carrow, MD 01/31/20 Jaye Beagle    Benjiman Core, MD 02/01/20 0003

## 2020-02-03 LAB — URINE CULTURE: Culture: 60000 — AB

## 2020-02-04 ENCOUNTER — Telehealth: Payer: Self-pay

## 2020-02-04 NOTE — Telephone Encounter (Signed)
No treatment for UC ED 01/31/20 no abx per  Dietrich Pates PA

## 2020-02-21 ENCOUNTER — Telehealth: Payer: Self-pay | Admitting: Adult Health Nurse Practitioner

## 2020-02-21 NOTE — Telephone Encounter (Signed)
Called patient's home number to schedule Palliative Consult, spoke with the Aide that was in the home and he said that I needed to speak with Rogue Jury and he gave me her number to call.  Called Tammy to schedule visit, no answer - left message with reason for call along with my name and call back number.

## 2020-02-24 ENCOUNTER — Telehealth: Payer: Self-pay | Admitting: Adult Health Nurse Practitioner

## 2020-02-24 NOTE — Telephone Encounter (Signed)
Returned call to [employer], patient's granddaughter, and spoke with her about scheduling a Palliative Consult and she was in agreement with this.  I have scheduled an In-home Consult for 03/04/20 @ 2:30 PM.

## 2020-03-04 ENCOUNTER — Other Ambulatory Visit: Payer: Self-pay

## 2020-03-04 ENCOUNTER — Other Ambulatory Visit: Payer: Medicare Other | Admitting: Adult Health Nurse Practitioner

## 2020-03-04 DIAGNOSIS — R339 Retention of urine, unspecified: Secondary | ICD-10-CM

## 2020-03-04 DIAGNOSIS — G20 Parkinson's disease: Secondary | ICD-10-CM

## 2020-03-04 DIAGNOSIS — L89152 Pressure ulcer of sacral region, stage 2: Secondary | ICD-10-CM

## 2020-03-04 DIAGNOSIS — L299 Pruritus, unspecified: Secondary | ICD-10-CM

## 2020-03-04 DIAGNOSIS — R059 Cough, unspecified: Secondary | ICD-10-CM

## 2020-03-04 DIAGNOSIS — Z515 Encounter for palliative care: Secondary | ICD-10-CM

## 2020-03-04 NOTE — Progress Notes (Signed)
Therapist, nutritional Palliative Care Consult Note Telephone: [phone]  Fax: [phone]  PATIENT NAME: Gregory Griffin DOB: [birth year] MRN: [id number]  PRIMARY CARE PROVIDER:   [facility]  REFERRING PROVIDER:  Dr. Cristopher Peru  RESPONSIBLE PARTY:   Rogue Jury, granddaughter [phone]  Chief complaint: Initial palliative visit for complex medical decision making    RECOMMENDATIONS and PLAN:  1.  Advanced care planning.  Started discussion with granddaughter about ACP and will go over again in future visit  2.  Parkinson's disease.  Patient symptoms of abdominal pain and weakness believed to have been related to progression of his Parkinson's.  Sinemet was increased and patient was displaying side effects of the medication which seemed to have abated but is having intermittent urinary retention.  We will reach out to his neurologist with this concern  3.  Urinary retention.  Unsure if this is related to the increase in his Sinemet.  Granddaughter already encouraging increased fluid intake I have also encouraged to further increase intake of water.  Will reach out to neurology see above  4.  Cough.  Patient getting some relief with the Coricidin.  Not showing any other signs of infection lung sounds clear no fever.  Unsure if this could possibly be related to his chronic bronchitis have encouraged using a humidifier and possibly adding Flonase or Nasacort as patient is already taking Zyrtec.  Discouraged the use of Benadryl as this could worsen his urinary retention or cause delirium.  5.  Itching.  Believe this is related more to dry skin and with the colder weather and dry heat.  Have encouraged using lotion after each bath.  6.  Stage II sacral pressure injury.  Granddaughter states that he has been having pressure sores since being more bedridden.  States that they can come and go.  Patient requires assistance with positioning in bed  but will frequently ask to to be put back on his back.  Patient does have air mattress on hospital bed.  Granddaughter and caregivers do apply barrier cream.  Continue current plan of care and have encouraged more frequent position changes  Palliative will continue to monitor for symptom management/decline and make recommendations as needed.  Patient is having decline.  Last weight recorded is 180 pounds.  Patient does not look like he weighs that much.  Caregivers have commented that he has lost weight by his close and that he feels lighter.  We will reach out to hospice physicians for eligibility for hospice.  Granddaughter would be interested in hospice services if eligible.  Have follow-up visit in 2 weeks  I spent 80 minutes providing this consultation. More than 50% of the time in this consultation was spent coordinating communication.    HISTORY OF PRESENT ILLNESS:  Gregory Griffin is a 79 y.o. year old male with multiple medical problems including Parkinson's disease, second-degree heart block, valvular heart disease, HTN, CAD, BPH, DMT2, GERD, COPD with chronic bronchitis.  Family history includes mother with MI; father history of stroke and seizures.  Patient denies any family history with Parkinson's.  Palliative Care was asked to help address goals of care.  Patient had ED visit on 10/17/2019 for second-degree heart block.  Was evaluated in the ED on 01/27/2020 for weakness and abdominal pain and was found to have third-degree heart block.  Was seen in ED on 01/31/2020 again for abdominal pain and diarrhea.  On 02/07/2020 patient's neurologist, Dr. Sherryll Burger, increased his Sinemet 25/100  mg from 1 tab 3 times daily to 2 tabs 3 times daily.  Granddaughter does state that for about 2 weeks after starting the increase in Sinemet that he would wake up hollering from bad dreams and was sleeping more.  She states that he is no longer sleeping as much though he does sleep a lot and is no longer having the  bad dreams.   Granddaughter also states that for almost 2 weeks now he has not been urinating as much.  States that she has tried increasing his fluid intake.  He will go through long periods of time without urinating and then will urinate a lot.  He is not complaining of any suprapubic pain but does have slight suprapubic distention noted today.  Patient does have history of BPH.  Granddaughter does state that patient takes 20 mg of Lasix daily. Patient lives in own home and has caregivers 24/7.  Granddaughter is primary caregiver and coordinates his care.  Granddaughter does state that he sleeps a lot during the day and is eating about 50% of what he used to eat.  Does have days where he will eat better than other days.  Does supplement with Glucerna.  Granddaughter states that patient has been wheelchair and bedbound for about 3 years now.  When his wheelchair broke in October he became completely bedbound.  Requires assistance with all ADLs except feeding.  Granddaughter does state that he was given silverware that had thicker handles and was heavier to help with the tremors while he was eating.  States that he does not like using these.  These may be too heavy for him at this point. Granddaughter states that he has had a cough for about a week started out as a rattling cough and became productive and then started to go away and started coming back again 3 days ago.  He is coughing up light yellow sputum.  They have been using Coricidin that has acetaminophen, guaifenesin, and dextromethorphan in it which seems to give him some relief.  Patient having itching over the past week or so.   Rest of 10 point ROS asked and negative.  Reviewed labs and imaging from previous ER visits with nothing overly concerning.  Twelve-lead EKG does have concerns for second-degree heart block going to third-degree heart block though patient asymptomatic.  Most of history obtained from granddaughter and chart review.   CODE  STATUS: See above  PPS: 30% HOSPICE ELIGIBILITY/DIAGNOSIS: TBD  PHYSICAL EXAM:  BP 130/52 HR 53 O2 98% on room air MU AC 32 cm M TC 42 cm (M UAC taken 5 inches from top of shoulder and M TC taken 7 inches up from top of knee) General: NAD, frail appearing, thin Cardiovascular: regular rate and rhythm Pulmonary: Lung sounds clear; normal respiratory effort Abdomen: soft, nontender, + bowel sounds GU: Some suprapubic distention but no tenderness with palpation Extremities: no edema, no joint deformities Skin: no rashes on exposed skin MSK: Moderate sarcopenia with contractures starting to legs and toes and left arm; Neurological:  alert and oriented x3   PAST MEDICAL HISTORY:  Past Medical History:  Diagnosis Date   AV block, 2nd degree    Benign paroxysmal positional vertigo    BPH (benign prostatic hyperplasia)    CAD (coronary artery disease)    Chronic bronchitis (HCC)    Diabetes mellitus, type II (HCC)    GERD (gastroesophageal reflux disease)    HLD (hyperlipidemia)    HTN (hypertension)  OSA (obstructive sleep apnea)    SOB (shortness of breath)    Urinary retention    Valvular heart disease     SOCIAL HX:  Social History   Tobacco Use   Smoking status: Former Smoker   Smokeless tobacco: Never Used   Tobacco comment: quit 12 years ago  Substance Use Topics   Alcohol use: No    Alcohol/week: 0.0 standard drinks    ALLERGIES:  Allergies  Allergen Reactions   Percocet [Oxycodone-Acetaminophen] Nausea And Vomiting     PERTINENT MEDICATIONS:  Outpatient Encounter Medications as of 03/04/2020  Medication Sig   amLODipine (NORVASC) 10 MG tablet Take 10 mg by mouth daily.   apixaban (ELIQUIS) 5 MG TABS tablet Take 1 tablet (5 mg total) by mouth 2 (two) times daily.   aspirin EC 81 MG tablet Take 81 mg by mouth at bedtime.    buPROPion (WELLBUTRIN SR) 150 MG 12 hr tablet Take 150 mg by mouth 2 (two) times daily.    carbidopa-levodopa  (PARCOPA) 25-100 MG disintegrating tablet Take 1 tablet by mouth 3 (three) times daily.    cephALEXin (KEFLEX) 500 MG capsule Take 1 capsule (500 mg total) by mouth every 12 (twelve) hours.   finasteride (PROSCAR) 5 MG tablet TAKE 1 TABLET BY MOUTH EVERY DAY   furosemide (LASIX) 20 MG tablet Take 20 mg by mouth daily.    ipratropium-albuterol (DUONEB) 0.5-2.5 (3) MG/3ML SOLN Take 3 mLs by nebulization 2 (two) times daily as needed.   isosorbide mononitrate (IMDUR) 30 MG 24 hr tablet Take 1 tablet (30 mg total) by mouth daily.   lactulose (CHRONULAC) 10 GM/15ML solution Take 10 g by mouth daily as needed.    lisinopril (ZESTRIL) 40 MG tablet Take 40 mg by mouth daily.   loratadine (CLARITIN) 10 MG tablet Take 10 mg by mouth daily.   Magnesium Oxide 500 MG TABS Take 1 tablet by mouth daily.   Multiple Vitamins-Minerals (CENTRUM SILVER ADULT 50+ PO) Take by mouth.   Multiple Vitamins-Minerals (PRESERVISION AREDS 2) CAPS Take 1 capsule by mouth 2 (two) times a day.   omeprazole (PRILOSEC) 20 MG capsule Take 20 mg by mouth daily.   potassium chloride (K-DUR) 10 MEQ tablet TK 1 T PO D PRN WITH FLUID PILL   rOPINIRole (REQUIP) 0.25 MG tablet Take 0.25 mg by mouth 3 (three) times daily.   rOPINIRole (REQUIP) 1 MG tablet Take 1 mg by mouth at bedtime.   sennosides-docusate sodium (SENOKOT-S) 8.6-50 MG tablet Take 2 tablets by mouth 2 (two) times daily.   simvastatin (ZOCOR) 20 MG tablet Take 20 mg by mouth daily.   tamsulosin (FLOMAX) 0.4 MG CAPS capsule TAKE ONE CAPSULE BY MOUTH DAILY (Patient taking differently: Take 0.4 mg by mouth daily. )   traMADol (ULTRAM) 50 MG tablet Take 0.5 tablets (25 mg total) by mouth every 12 (twelve) hours as needed for severe pain.   No facility-administered encounter medications on file as of 03/04/2020.     Kaimana Neuzil Marlena Clipper, NP

## 2020-03-05 ENCOUNTER — Telehealth: Payer: Self-pay | Admitting: Adult Health Nurse Practitioner

## 2020-03-05 NOTE — Telephone Encounter (Signed)
Called Dr. Margaretmary Eddy office and left message with receptionist about recent symptoms after starting increased dosage of sinemet. Becki Mccaskill K. Garner Nash NP

## 2020-03-05 NOTE — Telephone Encounter (Signed)
Spoke with granddaughter about hospice eligibility and if she wanted to proceed with hospice services.  She agreed to hospice services and gave me [facility] for [facility].  Reached out to [employer] RN at [facility] and left message with reason for call and call back info Terressa Evola K. Garner Nash NP

## 2020-03-06 ENCOUNTER — Telehealth: Payer: Self-pay | Admitting: Adult Health Nurse Practitioner

## 2020-03-06 NOTE — Telephone Encounter (Signed)
Dr. Margaretmary Eddy office had called back indicating that he could reduce his sinemet with the intermittent urinary retention he has been having.  Possibly do a UA to rule out UTI.  Informed that he is transitioning to hospice and will let them know.   Relayed this information to the granddaughter.  States that he has urinated more frequently over the past couple of days and that he is doing better with stiffness at the higher dosage of sinemet and does not want to decrease it.  She states that his urine is same color with no foul odor or hematuria and does not suspect UTI.  Encouraged to continue pushing oral fluids. Tymara Saur K. Garner Nash NP

## 2020-03-06 NOTE — Telephone Encounter (Signed)
Attempted to contact N[facility] Lau[facility] with [facility] about [facility] referral for patient.  Left VM with reason for call and call back info  Also referral office received call from [place] but have not received referral yet.  Attempted to call number left and there was no answer and no option to leave a VM.  Kameren Baade K. Garner Nash NP

## 2020-03-06 NOTE — Telephone Encounter (Signed)
Had received call back from [place] stating that referral for hospice was sent to our office.   Called granddaughter to let her know.  Encouraged to call with any questions prior to getting established with hospice.  Milana Salay K. Garner Nash NP

## 2020-03-18 ENCOUNTER — Other Ambulatory Visit: Payer: Medicare Other | Admitting: Adult Health Nurse Practitioner

## 2020-08-02 DEATH — deceased

## 2020-12-01 IMAGING — CT CT ABD-PELV W/ CM
2 of 5 series · 16 of 46 positions shown, 18 images · IV contrast (APPLIED)
Comparison: None.

CLINICAL DATA: Pelvic pain for 3 days.

EXAM:
CT ABDOMEN AND PELVIS WITH CONTRAST
TECHNIQUE: Multidetector CT imaging of the abdomen and pelvis was performed
using the standard protocol following bolus administration of
intravenous contrast.
CONTRAST:  100mL OMNIPAQUE IOHEXOL 300 MG/ML  SOLN

[Series 2: routine abd/pel with · axial · 0.86mm/px · z∈[-808,-403]mm · 13 of 91 slices shown, 15 images]
[im 5/91  soft-tissue]
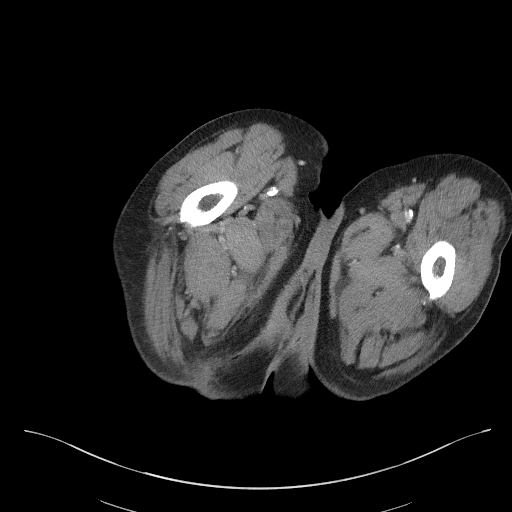
[im 5/91  bone]
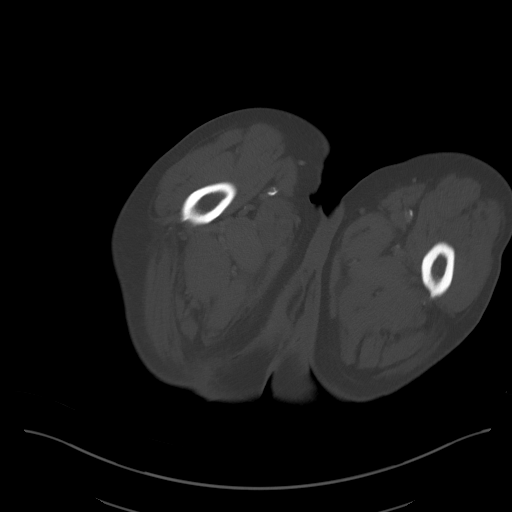
[im 15/91  soft-tissue]
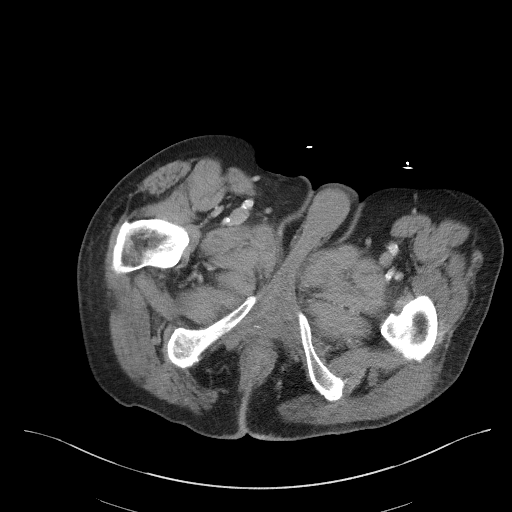
[im 19/91  soft-tissue]
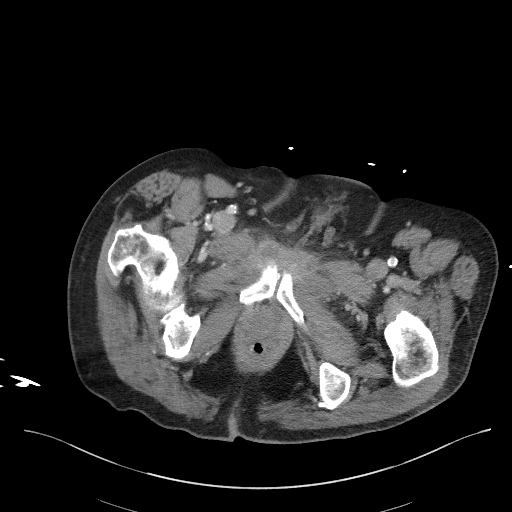
[im 24/91  soft-tissue]
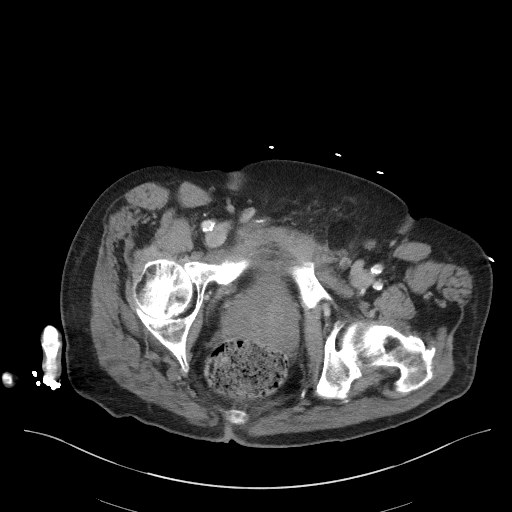
[im 34/91  soft-tissue]
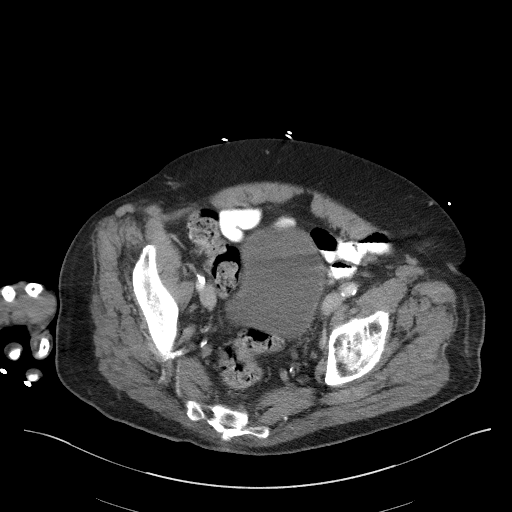
[im 38/91  soft-tissue]
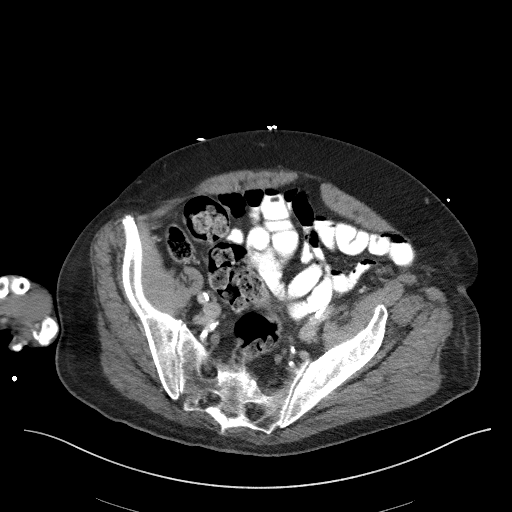
[im 48/91  soft-tissue]
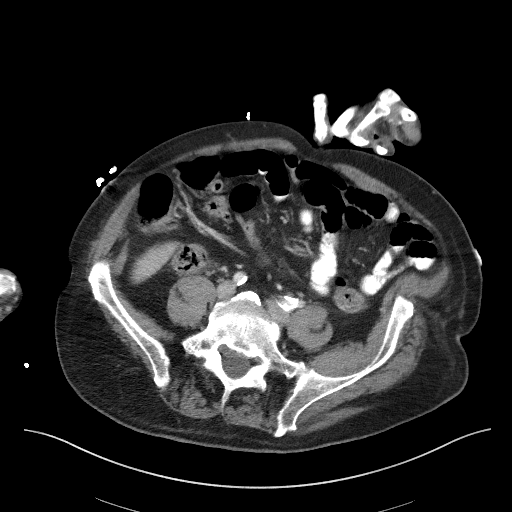
[im 53/91  soft-tissue]
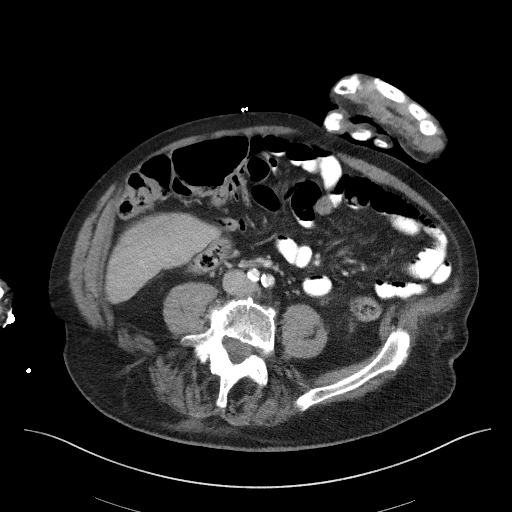
[im 57/91  soft-tissue]
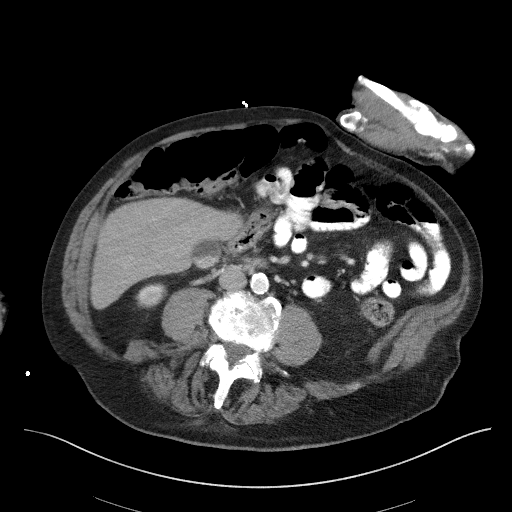
[im 57/91  bone]
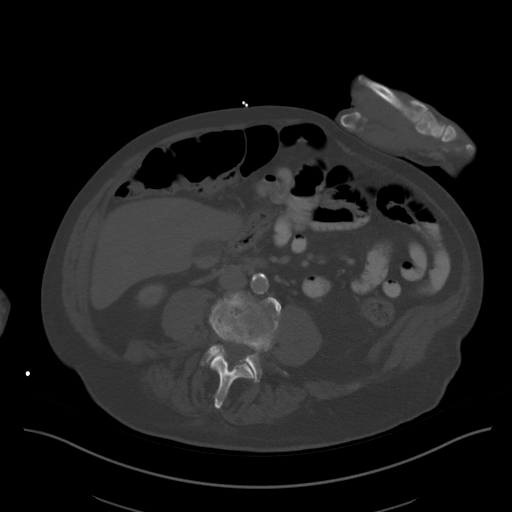
[im 67/91  soft-tissue]
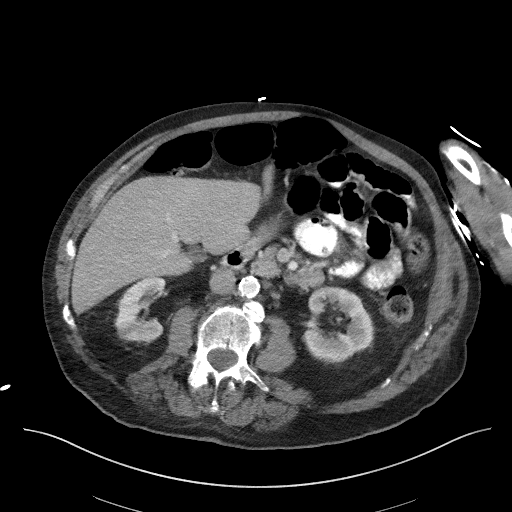
[im 72/91  soft-tissue]
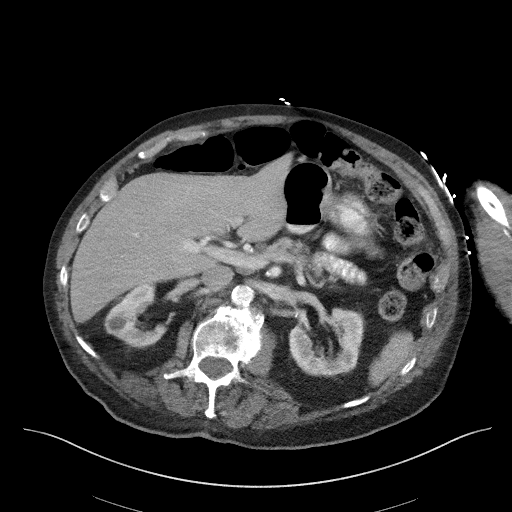
[im 76/91  soft-tissue]
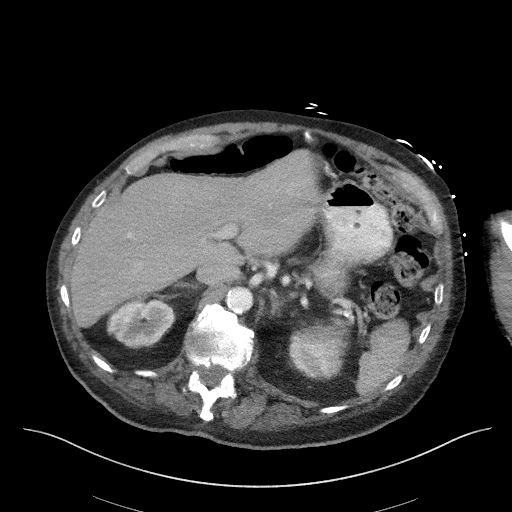
[im 86/91  soft-tissue]
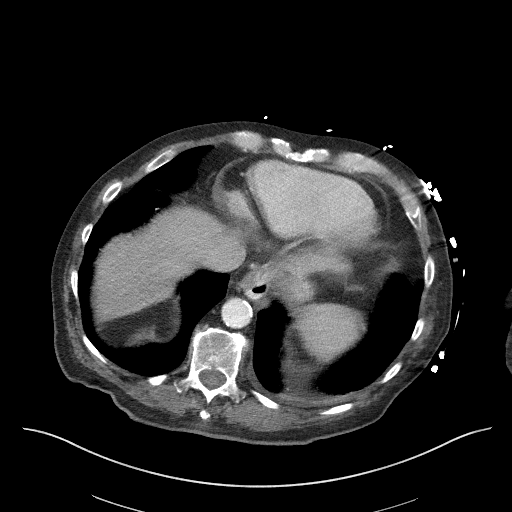

[Series 5: coronal st · coronal · 0.86mm/px · 3 of 116 slices shown]
[im 39/116  soft-tissue]
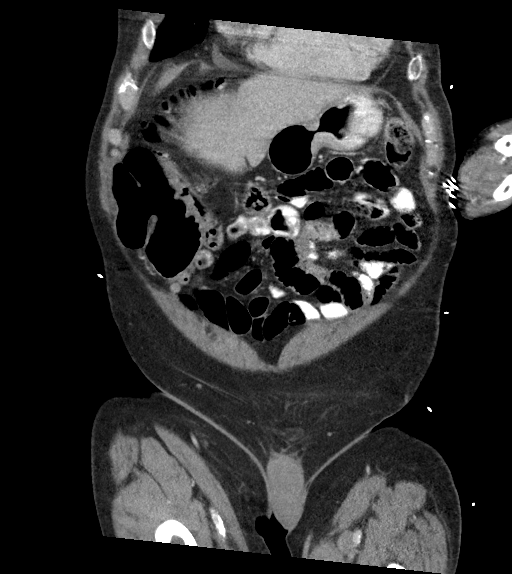
[im 52/116  soft-tissue]
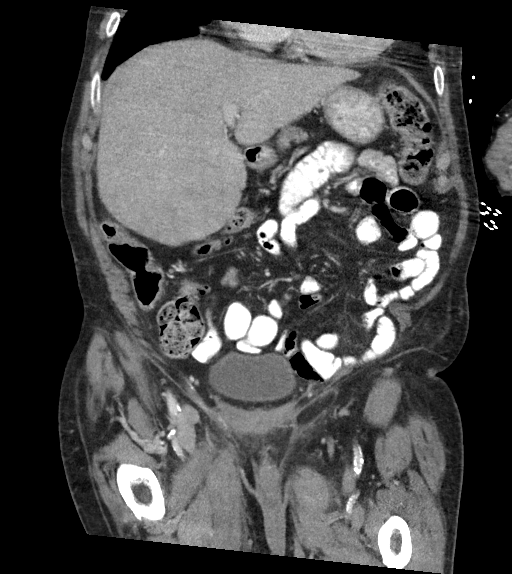
[im 64/116  soft-tissue]
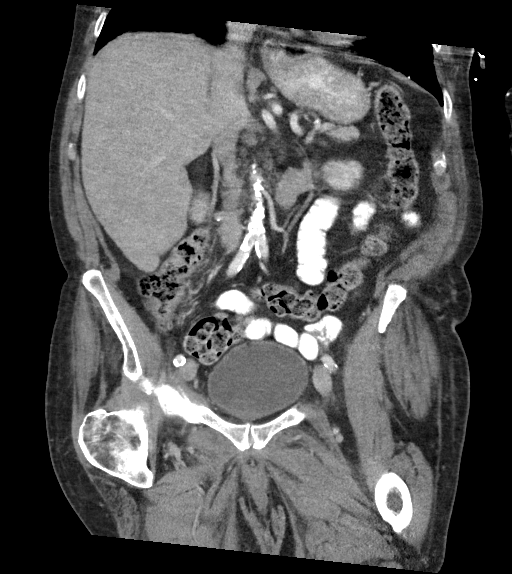

[16 of 46 positions shown; findings below may reference images not displayed]

FINDINGS: Lower chest: The lung bases are clear of an acute process. No
pleural effusion or pulmonary lesions. The heart is borderline
enlarged. Aortic and coronary artery calcifications are noted. The
distal esophagus is grossly normal.

Hepatobiliary: No hepatic lesions are identified. No intrahepatic
biliary dilatation. Layering gallstones are noted in the gallbladder
but no findings for acute cholecystitis. No common bile duct
dilatation.

Pancreas: Mass, inflammation or ductal dilatation.

Spleen: Normal size.  No cul lesions.

Adrenals/Urinary Tract: Adrenal glands and kidneys are unremarkable.
No worrisome renal lesions or hydronephrosis. Simple appearing right
renal cysts are noted. The bladder is unremarkable. No bladder mass
or bladder calculi.

Stomach/Bowel: The stomach, duodenum, small bowel and colon are
unremarkable. No acute inflammatory changes, mass lesions or
obstructive findings. The terminal ileum is normal. The appendix is
normal.

Vascular/Lymphatic: Advanced atherosclerotic calcifications
involving the aorta and iliac arteries. No focal aneurysm or
dissection. The major venous structures are patent.

No mesenteric or retroperitoneal mass or adenopathy.

Reproductive: Enlarged prostate gland with median lobe hypertrophy
impressing on the base the bladder. The seminal vesicles are grossly
normal.

Other: No pelvic mass or adenopathy. No free pelvic fluid
collections. No inguinal mass or adenopathy. No abdominal wall
hernia or subcutaneous lesions.

Musculoskeletal: No significant bony findings. Degenerative changes
involving the lumbar spine noted. No fracture or bone lesion.
IMPRESSION: 1. No acute abdominal/pelvic findings, mass lesions or adenopathy.
2. Enlarged prostate gland with median lobe hypertrophy impressing
on the base the bladder.
3. Cholelithiasis without CT findings for acute cholecystitis.
4. Advanced atherosclerotic calcifications involving the aorta and
iliac arteries.
5. Aortic atherosclerosis.

Aortic Atherosclerosis ([id number]).
